# Patient Record
Sex: Female | Born: 1995 | Race: White | Hispanic: Yes | Marital: Single | Smoking: Never smoker
Health system: Southern US, Community
[De-identification: ages and names within clinical notes are randomized; demographics above are authoritative.]

## PROBLEM LIST (undated history)

## (undated) ENCOUNTER — Inpatient Hospital Stay (HOSPITAL_COMMUNITY): Payer: Self-pay

## (undated) DIAGNOSIS — G43909 Migraine, unspecified, not intractable, without status migrainosus: Secondary | ICD-10-CM

## (undated) DIAGNOSIS — K219 Gastro-esophageal reflux disease without esophagitis: Secondary | ICD-10-CM

## (undated) DIAGNOSIS — F410 Panic disorder [episodic paroxysmal anxiety] without agoraphobia: Secondary | ICD-10-CM

## (undated) HISTORY — DX: Panic disorder (episodic paroxysmal anxiety): F41.0

## (undated) HISTORY — DX: Gastro-esophageal reflux disease without esophagitis: K21.9

## (undated) HISTORY — DX: Migraine, unspecified, not intractable, without status migrainosus: G43.909

---
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
## 2013-05-05 ENCOUNTER — Emergency Department (INDEPENDENT_AMBULATORY_CARE_PROVIDER_SITE_OTHER)
Admission: EM | Admit: 2013-05-05 | Discharge: 2013-05-05 | Disposition: A | Discharge status: Home / Self Care | Payer: Medicaid Other | Source: Home / Self Care | Attending: Family Medicine | Admitting: Family Medicine

## 2013-05-05 ENCOUNTER — Encounter (HOSPITAL_COMMUNITY): Payer: Self-pay | Admitting: Emergency Medicine

## 2013-05-05 DIAGNOSIS — K5289 Other specified noninfective gastroenteritis and colitis: Secondary | ICD-10-CM

## 2013-05-05 DIAGNOSIS — K529 Noninfective gastroenteritis and colitis, unspecified: Secondary | ICD-10-CM

## 2013-05-05 LAB — POCT PREGNANCY, URINE: PREG TEST UR: NEGATIVE

## 2013-05-05 LAB — POCT URINALYSIS DIP (DEVICE)
Bilirubin Urine: NEGATIVE
GLUCOSE, UA: NEGATIVE mg/dL
Ketones, ur: NEGATIVE mg/dL
NITRITE: NEGATIVE
PROTEIN: NEGATIVE mg/dL
Specific Gravity, Urine: 1.02 (ref 1.005–1.030)
UROBILINOGEN UA: 0.2 mg/dL (ref 0.0–1.0)
pH: 6 (ref 5.0–8.0)

## 2013-05-05 MED ORDER — ONDANSETRON 4 MG PO TBDP
4.0000 mg | ORAL_TABLET | Freq: Once | ORAL | Status: AC
  Administered 2013-05-05: 4 mg via ORAL

## 2013-05-05 MED ORDER — ONDANSETRON HCL 4 MG PO TABS: 4.0000 mg | ORAL_TABLET | Freq: Three times a day (TID) | ORAL | Status: DC | PRN

## 2013-05-05 MED ORDER — ONDANSETRON 4 MG PO TBDP
ORAL_TABLET | ORAL | Status: AC
  Filled 2013-05-05: qty 1

## 2013-05-05 NOTE — ED Notes (Signed)
Pt given sprite for fluid challenge.  Mw,cma

## 2013-05-05 NOTE — ED Provider Notes (Signed)
CSN: [id number]     Arrival date & time 05/05/13  1206 History   First MD Initiated Contact with Patient 05/05/13 1241     Chief Complaint  Patient presents with  . Emesis  . Constipation   (Consider location/radiation/quality/duration/timing/severity/associated sxs/prior Treatment) HPI Comments: Patient reports 3 days of nausea and and diarrhea with associated abdominal cramping. Denies vomiting but states she feels "queasy" when she tries to eat or drink something. States occasional small amount of blood on toilet tissue when she wipes, but she assumes this is from frequent BM's. Denies urinary symptoms. No known ill contacts.  LNMP: 04/09/2013 No recent travel, antibiotic use or raw meats or seafood.   The history is provided by the patient and a parent.    Past Medical History  Diagnosis Date  . Pregnant state, incidental   . No pertinent past medical history   . GERD (gastroesophageal reflux disease)    Past Surgical History  Procedure Laterality Date  . No past surgeries    . Cesarean section  11/23/2011    Procedure: CESAREAN SECTION;  Surgeon: Antionette Char, MD;  Location: [facility];  Service: Gynecology;  Laterality: N/A;  Primary Cesarean Section Delivery Boy @ 0240,  Apgars 6/8   Family History  Problem Relation Age of Onset  . Hyperthyroidism Mother   . Asthma Brother   . Hypertension Maternal Grandmother   . Hyperthyroidism Maternal Grandmother    History  Substance Use Topics  . Smoking status: Never Smoker   . Smokeless tobacco: Never Used  . Alcohol Use: No   OB History   Grav Para Term Preterm Abortions TAB SAB Ect Mult Living   1 1 1  0 0 0 0 0 0 0     Review of Systems  Constitutional: Positive for appetite change. Negative for fever and chills.  HENT: Negative.   Eyes: Negative.   Respiratory: Negative.   Cardiovascular: Negative.   Gastrointestinal: Positive for nausea and diarrhea. Negative for vomiting and abdominal pain.       See HPI   Endocrine: Negative for polydipsia, polyphagia and polyuria.  Genitourinary: Negative.   Musculoskeletal: Negative.   Skin: Negative.   Neurological: Negative.   Psychiatric/Behavioral: Negative for confusion.    Allergies  Review of patient's allergies indicates no known allergies.  Home Medications   Current Outpatient Rx  Name  Route  Sig  Dispense  Refill  . HYDROcodone-acetaminophen (NORCO/VICODIN) 5-325 MG per tablet   Oral   Take 1 tablet by mouth every 6 (six) hours as needed for pain (Take 1 - 2 tablets every 4 - 6 hours.).   20 tablet   0   . levonorgestrel (MIRENA) 20 MCG/24HR IUD   Intrauterine   1 each by Intrauterine route once.         . methocarbamol (ROBAXIN) 750 MG tablet   Oral   Take 1 tablet (750 mg total) by mouth 4 (four) times daily as needed (Take 1 tablet every 6 hours as needed for muscle spasms.).   20 tablet   0   . Multiple Vitamin (MULTIVITAMIN WITH MINERALS) TABS   Oral   Take 1 tablet by mouth daily.         . naproxen (NAPROSYN) 500 MG tablet   Oral   Take 1 tablet (500 mg total) by mouth 2 (two) times daily as needed.   30 tablet   0    BP 110/76  Pulse 80  Temp(Src) 98.6 F (37 C) (  Oral)  Resp 20  SpO2 98%  LMP 04/09/2013  Breastfeeding? No Physical Exam  Nursing note and vitals reviewed. Constitutional: She is oriented to person, place, and time. She appears well-developed and well-nourished. No distress.  HENT:  Head: Normocephalic and atraumatic.  Mouth/Throat: Oropharynx is clear and moist.  Eyes: Conjunctivae are normal. No scleral icterus.  Neck: Normal range of motion and full passive range of motion without pain. Neck supple.  Cardiovascular: Normal rate, regular rhythm and normal heart sounds.   Pulmonary/Chest: Effort normal and breath sounds normal.  Abdominal: Soft. Bowel sounds are normal. She exhibits no distension and no mass. There is no tenderness. There is no rebound, no guarding and no CVA  tenderness.  Musculoskeletal: Normal range of motion.  Lymphadenopathy:    She has cervical adenopathy.  Neurological: She is alert and oriented to person, place, and time.  Skin: Skin is warm and dry. No rash noted.  Psychiatric: She has a normal mood and affect. Her behavior is normal.    ED Course  Procedures (including critical care time) Labs Review Labs Reviewed - No data to display Imaging Review No results found.    MDM  UA only with trace LE and Hgb. Will send for C&S.  UPT negative. Exam and hx largely consistent with viral gastroenteritis. No focal abdominal pain. Will advise clear liquid diet for next 12-24 hours and zofran as prescribed for nausea. Has appointment with [facility] that she should keep for follow up on 05/06/2013. If symptoms become suddenly worse or severe, she should report to her nearest ER for assistance.   Jess BartersJennifer Lee Pleasant ViewPresson, [place] 05/05/13 1351

## 2013-05-05 NOTE — ED Notes (Signed)
C/o vomiting and constipation x 3 days.   States did have one diarrhea stool yesterday.  Noticed blood in stool for the past 3 days. Also having abdominal cramping and dizziness.   No otc meds tried for symptoms.

## 2013-05-06 ENCOUNTER — Ambulatory Visit (INDEPENDENT_AMBULATORY_CARE_PROVIDER_SITE_OTHER): Payer: Medicaid Other | Admitting: Family Medicine

## 2013-05-06 ENCOUNTER — Encounter: Payer: Self-pay | Admitting: Family Medicine

## 2013-05-06 VITALS — BP 112/70 | HR 64 | Temp 97.7°F | Ht 61.0 in | Wt 120.0 lb

## 2013-05-06 DIAGNOSIS — R112 Nausea with vomiting, unspecified: Secondary | ICD-10-CM

## 2013-05-06 DIAGNOSIS — R5381 Other malaise: Secondary | ICD-10-CM

## 2013-05-06 DIAGNOSIS — R531 Weakness: Secondary | ICD-10-CM | POA: Insufficient documentation

## 2013-05-06 DIAGNOSIS — D509 Iron deficiency anemia, unspecified: Secondary | ICD-10-CM

## 2013-05-06 DIAGNOSIS — Z23 Encounter for immunization: Secondary | ICD-10-CM

## 2013-05-06 DIAGNOSIS — R5383 Other fatigue: Secondary | ICD-10-CM

## 2013-05-06 LAB — CBC WITH DIFFERENTIAL/PLATELET
BASOS ABS: 0 10*3/uL (ref 0.0–0.1)
BASOS PCT: 0 % (ref 0–1)
EOS ABS: 0.1 10*3/uL (ref 0.0–0.7)
EOS PCT: 1 % (ref 0–5)
HCT: 37.2 % (ref 36.0–46.0)
Hemoglobin: 13 g/dL (ref 12.0–15.0)
LYMPHS ABS: 1.3 10*3/uL (ref 0.7–4.0)
Lymphocytes Relative: 31 % (ref 12–46)
MCH: 28.8 pg (ref 26.0–34.0)
MCHC: 34.9 g/dL (ref 30.0–36.0)
MCV: 82.5 fL (ref 78.0–100.0)
Monocytes Absolute: 0.4 10*3/uL (ref 0.1–1.0)
Monocytes Relative: 9 % (ref 3–12)
NEUTROS PCT: 59 % (ref 43–77)
Neutro Abs: 2.5 10*3/uL (ref 1.7–7.7)
PLATELETS: 162 10*3/uL (ref 150–400)
RBC: 4.51 MIL/uL (ref 3.87–5.11)
RDW: 13.7 % (ref 11.5–15.5)
WBC: 4.2 10*3/uL (ref 4.0–10.5)

## 2013-05-06 MED ORDER — POLYETHYLENE GLYCOL 3350 17 GM/SCOOP PO POWD: 17.0000 g | Freq: Two times a day (BID) | ORAL | Status: DC | PRN

## 2013-05-06 NOTE — Patient Instructions (Signed)
Dieta para la diarrea - Adultos   (Diet for Diarrhea, Adult)   Las deposiciones acuosas frecuentes (diarrea) pueden ser originados o empeorar por algunos alimentos o bebidas. La diarrea puede mejorarse con un cambio en la dieta. Como la diarrea puede durar hasta 7 días, es fácil perder mucha cantidad de líquidos del organismo y deshidratarse. Los líquidos que se pierden deben reponerse. Junto con una dieta equilibrada y beber una gran cantidad de líquido para mantener la orina de tono claro o color amarillo pálido.  INSTRUCCIONES PARA LA DIETA   · Asegure una adecuada ingesta de líquidos (hidratación). Evite los líquidos que contengan azúcares simples o las bebidas deportivas, los jugos de frutas, los productos derivados de la leche entera y las gaseosas. Si bebe la cantidad suficiente de líquidos, la orina debe ser clara o amarillo pálido. Una solución de rehidratación oral se puede comprar en las farmacias, en las tiendas minoristas y por Internet. Se puede preparar una solución de rehidratación oral casera con los siguientes ingredientes:  ·    cucharadita de sal.  · ¾ cucharadita de bicarbonato.  ·  de cucharadita de sal sustituta (cloruro de potasio).  · 1  cucharada de azúcar.  · 1l (34 onzas) de agua.  · Ciertos alimentos y bebidas pueden aumentar la velocidad a la que el alimento progresa a través del tracto gastrointestinal (GI). Estos alimentos y bebidas deben evitarse e incluyen:  · Bebidas alcohólicas y con cafeína.  · Alimentos ricos en fibra, como frutas y verduras, nueces, semillas, panes y cereales integrales.  · Alimentos y bebidas endulzados con alcoholes de azúcar, tales como xilitol, sorbitol, y manitol.  · Algunos alimentos pueden ser bien tolerados y puede ayudar a espesar las heces, incluyendo:  · Alimentos con almidón, como arroz, pan, pasta, cereales bajos en azúcar, avena, sémola de maíz, papas al horno, galletas y panecillos.    · Bananas.    · Puré de manzana.  · Agregue alimentos ricos  en probióticos a la dieta del niño para ayudar a aumentar las bacterias saludables en el tracto gastrointestinal, como el yogur y productos lácteos fermentados.  ALIMENTOS Y BEBIDAS RECOMENDADOS   Féculas  Alimentos con menos de 2 g de fibra por porción.   · Recomendados:  Pan blanco, francés, pita, bollos y rosquillas. Muffins, pan ácimo. Galletas de agua, saladas o de graham. Pretzel, biscotes, bizcochos. Maíz refinado, trigo, arroz. Patatas preparadas de cualquier modo sin piel, macaroni, espaghetti, fideos, arroz refinado.  · Evite:  Pan, bollos o galletas preparadas con trigo entero, multigranos, salvado, semillas, frutos secos o coco. Tortillas de maíz o tacos. Cereales que contengan granos enteros, multigranos, salvado, coco, frutos secos o pasas de uva. Harina de avena cocida o seca. Cereales de grano grueso, granola. Cereales promocionados como con "alto contenido de fibra". Cáscara de patatas. Pasta integral, arroz marrón, arroz salvaje. Palomitas de maíz. Batatas. Panecillos dulces, donas, panqueques, waffles, pan dulce.  Vegetales  · Recomendados: Jugo de tomates o de vegetales. Vegetales bien cocidos o enlatados sin semillas. Frescos Lechuga tierna, pepino sin cáscara, repollos, espinacas, brotes de soja.  · Evite: [place], cocidos o enlatados: [place]as, porotos, remolacha, bróccoli, repollitos de [place], maíz, coles, legumbres, arvejas, batatas. Cocidos: Repollo verde o rojo, espinacas. Evite las porciones grandes de cualquier vegetal, debido a que los vegetales disminuyen su tamaño al cocinarlos y contienen más fibras por porción.  Frutas  · Recomendados: Cocidas o enlatadas: Duraznos, puré de manzanas, melón, cerezas, cóctel de frutas, pomelo, uvas, kiwi, naranjas, melocotón, pera, ciruelas,   sandías. Frescos: Manzanas sin la piel, banana madura, uvas, melón, cerezas, pomelo, duraznos, naranjas, ciruelas. Limite las porciones a ½ taza o1 unidad.  · Evite: Frescos: Manzana con piel, damasco, mango,  pera, frambuesa, frutillas. Jugo de ciruela, compota o ciruelas secas. Frutas secas, pasas de uva, dátiles. Evite porciones grandes de todas las frutas frescas.  Proteínas  · Recomendados: Carne molida o un bife tierno bien cocido, jamón, ternera, cordero, cerdo o aves. Huevos. pescado, ostra, langostinos, langosta, frutos de mar. Hígado y otros órganos.  · Evite: Carnes duras y fibrosas con cartílago. Mantequilla de maní, suave o entera. Queso, frutos secos, semillas, legumbres, arvejas secas, lentejas.  Lácteos:  · Recomendados: Yogur, leche sin lactosa, kéfir, yogur bebible, suero de leche, leche de soja, o queso duro común.  · Evite: Leche, leche con chocolate, bebidas a base de leche, tales como batidos.  Sopas  · Recomendados: Consomé, caldo o sopas hechas con los alimentos permitidos. Cualquier sopa colada.  · Evite: Sopas hechas con vegetales no permitidos, sopas basadas en cremas o leche.  Postres y dulces  · Recomendados: Gelatina sin azúcar, helados de agua sin azúcar.  · Evite: Tortas y masitas, pasteles hechos con frutas permitidas, budines, natillas, pasteles con crema. Gelatina, frutas, hielo, sorbetes, helados de agua. Helados, batidos sin frutos secos. Caramelos duros, miel, gelatina, melaza, jarabes, azúcar, jarabe de chocolate, pastillas de goma, malvaviscos.  Grasas y aceites  · Recomendados: Limite las grasas a menos de 8 cucharaditas por día.  · Evite: Semillas, frutos secos, aceitunas, paltas. Margarina, manteca, crema, mayonesa, aceites para ensaladas, aderezos para ensaladas. Salsas, tocino sin corteza.  Bebidas  · Recomendados: Agua, tés descafeinados, soluciones de rehidratación oral, bebidas sin azúcar no endulzados con alcoholes de azúcar.  · Evite: Los jugos de frutas, bebidas con cafeína (café, té, refrescos), bebidas alcohólicas, bebidas deportivas, o gaseosa lima-limón.  Condimentos  · Recomendados: Ketchup, mostaza, rábano picante, el vinagre, el cacao en polvo. Especias con  moderación: pimienta de [place], alb[place], laurel, polvo u hojas de apio, canela, comino en polvo, polvo de curry, jengibre, macis, mejorana, cebolla o ajo en polvo, orégano, pimentón, perejil, pimienta, romero, salvia, ajedrea, estragón, tomillo, cúrcuma.  · Evite: Coco, miel.  Document Released: 03/20/2005 Document Revised: 12/13/2011  Exit[employer]® Patient Information ©2014 [employer].

## 2013-05-06 NOTE — Assessment & Plan Note (Signed)
3 days of n/v/d, likely constipation +/- viral gastro - brat diet, zofran - miralax clean out  - check TSH, strong family history of hypothyroid

## 2013-05-06 NOTE — Assessment & Plan Note (Signed)
H/o weakness with anemia and vitamin D deficiency - check CBC and vit D level given history - check TSH given family history - call with results and supplements if needed

## 2013-05-06 NOTE — Progress Notes (Signed)
   Subjective:    Patient ID: Rachel Mcclure, female    DOB: Nov 18, 1995, 18 y.o.   MRN: [id number]  HPI Pt presents for nausea/vomiting for 3 days. Pt reports chronic diarrhea with straining until 3 days ago when she started having diarrhea, n/v and LLQ abdominal pain. She has tried zofran which relieves the nausea but she still has the pain and diarrhea. She denies fevers, chills. She reports several sick contacts with similar symptoms over the past few weeks. She reports a small amount of blood on tissue after straining to go to the bathroom. Goes 4 days between bowel movements often and when she does go they are hard balls. She also reports weakness which is similar to how she felt when she was anemic after giving birth, this has been going on for months and is not related to the GI symptoms.   Review of Systems  Constitutional: Negative for fever and chills.  Respiratory: Negative for shortness of breath.   Cardiovascular: Negative for chest pain.  Gastrointestinal: Positive for nausea, vomiting, abdominal pain, diarrhea, constipation and blood in stool.  Neurological: Positive for weakness and headaches.       Objective:   Physical Exam  Nursing note and vitals reviewed. Constitutional: She is oriented to person, place, and time. She appears well-developed and well-nourished. No distress.  HENT:  Head: Normocephalic and atraumatic.  Eyes: Conjunctivae are normal. Right eye exhibits no discharge. Left eye exhibits no discharge. No scleral icterus.  Neck: Normal range of motion. No thyromegaly present.  Cardiovascular: Normal rate, regular rhythm and normal heart sounds.   No murmur heard. Pulmonary/Chest: Effort normal and breath sounds normal. No respiratory distress. She has no wheezes.  Abdominal: Soft. Bowel sounds are normal. She exhibits no distension and no mass. There is tenderness. There is no rebound and no guarding.  LLQ tenderness. Diffuse fullness consistent with large  stool burden  Neurological: She is alert and oriented to person, place, and time.  Skin: Skin is warm and dry. No rash noted. She is not diaphoretic.  Psychiatric: She has a normal mood and affect. Her behavior is normal.          Assessment & Plan:

## 2013-05-07 ENCOUNTER — Telehealth: Payer: Self-pay | Admitting: Family Medicine

## 2013-05-07 ENCOUNTER — Other Ambulatory Visit: Payer: Self-pay | Admitting: Family Medicine

## 2013-05-07 LAB — VITAMIN D 25 HYDROXY (VIT D DEFICIENCY, FRACTURES): VIT D 25 HYDROXY: 19 ng/mL — AB (ref 30–89)

## 2013-05-07 LAB — TSH: TSH: 0.935 u[IU]/mL (ref 0.350–4.500)

## 2013-05-07 MED ORDER — CHOLECALCIFEROL 25 MCG (1000 UT) PO TABS: 1000.0000 [IU] | ORAL_TABLET | Freq: Every day | ORAL | Status: DC

## 2013-05-07 NOTE — Telephone Encounter (Signed)
Discussed lab results. Normal TSH and CBC, vit D deficiency. Supplement prescribed.  Abdominal symptoms somewhat improved since yesterday, still no bowel movement, advised continuing miralax.

## 2013-05-08 NOTE — ED Provider Notes (Signed)
Medical screening examination/treatment/procedure(s) were performed by a resident physician or non-physician practitioner and as the supervising physician I was immediately available for consultation/collaboration.  Clementeen GrahamEvan Domenick Quebedeaux, MD    Rodolph BongEvan S Alim Cattell, MD 05/08/13 [phone]

## 2013-12-21 ENCOUNTER — Emergency Department (HOSPITAL_COMMUNITY)
Admission: EM | Admit: 2013-12-21 | Discharge: 2013-12-21 | Discharge status: Against Medical Advice | Payer: Medicaid Other | Attending: Emergency Medicine | Admitting: Emergency Medicine

## 2013-12-21 ENCOUNTER — Encounter (HOSPITAL_COMMUNITY): Payer: Self-pay | Admitting: Emergency Medicine

## 2013-12-21 DIAGNOSIS — R209 Unspecified disturbances of skin sensation: Secondary | ICD-10-CM | POA: Diagnosis not present

## 2013-12-21 DIAGNOSIS — R064 Hyperventilation: Secondary | ICD-10-CM | POA: Insufficient documentation
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
## 2014-01-07 ENCOUNTER — Emergency Department (HOSPITAL_COMMUNITY): Payer: Medicaid Other

## 2014-01-07 ENCOUNTER — Emergency Department (HOSPITAL_COMMUNITY)
Admission: EM | Admit: 2014-01-07 | Discharge: 2014-01-07 | Disposition: A | Discharge status: Home / Self Care | Payer: Medicaid Other | Attending: Emergency Medicine | Admitting: Emergency Medicine

## 2014-01-07 ENCOUNTER — Encounter (HOSPITAL_COMMUNITY): Payer: Self-pay | Admitting: Emergency Medicine

## 2014-01-07 DIAGNOSIS — Z8719 Personal history of other diseases of the digestive system: Secondary | ICD-10-CM | POA: Diagnosis not present

## 2014-01-07 DIAGNOSIS — R1013 Epigastric pain: Secondary | ICD-10-CM | POA: Insufficient documentation

## 2014-01-07 DIAGNOSIS — R0789 Other chest pain: Secondary | ICD-10-CM | POA: Insufficient documentation

## 2014-01-07 DIAGNOSIS — R079 Chest pain, unspecified: Secondary | ICD-10-CM | POA: Diagnosis present

## 2014-01-07 LAB — CBC
HCT: 36.2 % (ref 36.0–46.0)
Hemoglobin: 12.6 g/dL (ref 12.0–15.0)
MCH: 29.2 pg (ref 26.0–34.0)
MCHC: 34.8 g/dL (ref 30.0–36.0)
MCV: 83.8 fL (ref 78.0–100.0)
Platelets: 178 10*3/uL (ref 150–400)
RBC: 4.32 MIL/uL (ref 3.87–5.11)
RDW: 12.3 % (ref 11.5–15.5)
WBC: 4.2 10*3/uL (ref 4.0–10.5)

## 2014-01-07 LAB — BASIC METABOLIC PANEL
Anion gap: 10 (ref 5–15)
BUN: 10 mg/dL (ref 6–23)
CO2: 25 mEq/L (ref 19–32)
CREATININE: 0.76 mg/dL (ref 0.50–1.10)
Calcium: 8.8 mg/dL (ref 8.4–10.5)
Chloride: 105 mEq/L (ref 96–112)
GFR calc non Af Amer: >90 mL/min (ref >90)
Glucose, Bld: 100 mg/dL — ABNORMAL HIGH (ref 70–99)
Potassium: 3.7 mEq/L (ref 3.7–5.3)
Sodium: 140 mEq/L (ref 137–147)

## 2014-01-07 LAB — I-STAT TROPONIN, ED: Troponin i, poc: 0.01 ng/mL (ref 0.00–0.08)

## 2014-01-07 MED ORDER — OMEPRAZOLE 20 MG PO CPDR: 20.0000 mg | DELAYED_RELEASE_CAPSULE | Freq: Every day | ORAL | Status: DC

## 2014-01-07 MED ORDER — SUCRALFATE 1 G PO TABS: 1.0000 g | ORAL_TABLET | Freq: Four times a day (QID) | ORAL | Status: DC

## 2014-01-07 NOTE — Discharge Instructions (Signed)
As discussed, your evaluation today has been largely reassuring.  But, it is important that you monitor your condition carefully, and do not hesitate to return to the ED if you develop new, or concerning changes in your condition.  Otherwise, please follow-up with your physician for appropriate ongoing care.  Your symptoms are likely due to irritation of your esophagus and/or chest wall.  You are being started on a course of medication for esophageal irritation.

## 2014-01-07 NOTE — ED Notes (Signed)
Pt. States pain is worse when she takes a deep breath. She states that the pain is reproducible when you press on her chest. It also hurts when she picks up her 35lbs. [age]yr. Old.

## 2014-01-07 NOTE — ED Notes (Signed)
Pt reports sob and R sided cp x 3 days. Pt does take birth control. Pt reports pain into front of neck as well. Pt speaking in full sentences, skin warm and dry. Pain increases with breathing.

## 2014-01-07 NOTE — ED Provider Notes (Signed)
CSN: [id number]     Arrival date & time 01/07/14  1908 History   First MD Initiated Contact with Patient 01/07/14 2202     Chief Complaint  Patient presents with  . Chest Pain     HPI  Patient presents with 3 days of sternal chest pain.  The pain is nonradiating, sore, severe, midline. There is no associated fever, chills, cough, dyspnea. The pain is nonexertional nonpleuritic. No abdominal pain. Patient has no history of heart disease, does not smoke. No family history of early coronary disease. No clear alleviating or exacerbating factors.  Past Medical History  Diagnosis Date  . Pregnant state, incidental   . No pertinent past medical history   . GERD (gastroesophageal reflux disease)    Past Surgical History  Procedure Laterality Date  . No past surgeries    . Cesarean section  11/23/2011    Procedure: CESAREAN SECTION;  Surgeon: Antionette Char, MD;  Location: [facility];  Service: Gynecology;  Laterality: N/A;  Primary Cesarean Section Delivery Boy @ 0240,  Apgars 6/8   Family History  Problem Relation Age of Onset  . Hyperthyroidism Mother   . Asthma Brother   . Hypertension Maternal Grandmother   . Hyperthyroidism Maternal Grandmother    History  Substance Use Topics  . Smoking status: Never Smoker   . Smokeless tobacco: Never Used  . Alcohol Use: No   OB History   Grav Para Term Preterm Abortions TAB SAB Ect Mult Living   1 1 1  0 0 0 0 0 0 0     Review of Systems  Constitutional:       Per HPI, otherwise negative  HENT:       Per HPI, otherwise negative  Respiratory:       Per HPI, otherwise negative  Cardiovascular:       Per HPI, otherwise negative  Gastrointestinal: Negative for vomiting.  Endocrine:       Negative aside from HPI  Genitourinary:       Neg aside from HPI   Musculoskeletal:       Per HPI, otherwise negative  Skin: Negative.   Neurological: Negative for syncope.      Allergies  Review of patient's allergies indicates no  known allergies.  Home Medications   Prior to Admission medications   Medication Sig Start Date End Date Taking? Authorizing Provider  ibuprofen (ADVIL,MOTRIN) 800 MG tablet Take 800 mg by mouth every 8 (eight) hours as needed for mild pain.   Yes Historical Provider, MD  levonorgestrel (MIRENA) 20 MCG/24HR IUD 1 each by Intrauterine route once. 04/24/2012    Historical Provider, MD   BP 124/81  Pulse 77  Temp(Src) 97.7 F (36.5 C) (Oral)  Resp 17  Ht 5\' 2"  (1.575 m)  Wt 115 lb (52.164 kg)  BMI 21.03 kg/m2  SpO2 100%  LMP 12/23/2013 Physical Exam  Nursing note and vitals reviewed. Constitutional: She is oriented to person, place, and time. She appears well-developed and well-nourished. No distress.  HENT:  Head: Normocephalic and atraumatic.  Eyes: Conjunctivae and EOM are normal.  Cardiovascular: Normal rate and regular rhythm.   Pulmonary/Chest: Effort normal and breath sounds normal. No stridor. No respiratory distress.  Abdominal: She exhibits no distension. There is tenderness in the epigastric area.  Musculoskeletal: She exhibits no edema.  Neurological: She is alert and oriented to person, place, and time. No cranial nerve deficit.  Skin: Skin is warm and dry.  Psychiatric: She has a normal  mood and affect.    ED Course  Procedures (including critical care time) Labs Review Labs Reviewed  BASIC METABOLIC PANEL - Abnormal; Notable for the following:    Glucose, Bld 100 (*)    All other components within normal limits  CBC  I-STAT TROPOININ, ED    Imaging Review Dg Chest 2 View  01/07/2014   CLINICAL DATA:  Right side chest pain and shortness of breath for 3 days.  EXAM: CHEST  2 VIEW  COMPARISON:  PA and lateral chest 04/16/2012.  FINDINGS: The lungs are clear. Heart size is normal. No pneumothorax or pleural effusion. No focal bony abnormality.  IMPRESSION: Negative chest.   Electronically Signed   By: Drusilla Kannerhomas  Dalessio M.D.   On: 01/07/2014 21:33     EKG  Interpretation   Date/Time:  Wednesday January 07 2014 19:15:15 EDT Ventricular Rate:  80 PR Interval:  120 QRS Duration: 76 QT Interval:  352 QTC Calculation: 405 R Axis:   87 Text Interpretation:  Normal sinus rhythm with sinus arrhythmia Normal ECG  Sinus rhythm Artifact Borderline ECG Confirmed by Gerhard MunchLOCKWOOD, Daiton Cowles  MD  ([id number]) on 01/07/2014 10:27:23 PM      Patient's evaluation is largely reassuring.  On repeat exam she is in no distress. I discussed all findings with patient and her mother.  The  MDM   This gentleman presents with new chest pain.  Patient's sternal discomfort, epigastric tenderness to palpation, and her presentation is most consistent with GI etiology. No physical exam findings consistent with PE, nor ACS.  Patient has minimal risk profile. Patient was discharged in stable condition with initiation of GI therapy, primary care followup   Gerhard Munchobert Tyrell Brereton, MD 01/07/14 2310

## 2014-01-23 ENCOUNTER — Ambulatory Visit (INDEPENDENT_AMBULATORY_CARE_PROVIDER_SITE_OTHER): Payer: Medicaid Other | Admitting: Family Medicine

## 2014-01-23 ENCOUNTER — Encounter: Payer: Self-pay | Admitting: Family Medicine

## 2014-01-23 VITALS — BP 122/73 | HR 60 | Temp 97.6°F | Wt 114.0 lb

## 2014-01-23 DIAGNOSIS — Z23 Encounter for immunization: Secondary | ICD-10-CM

## 2014-01-23 DIAGNOSIS — K219 Gastro-esophageal reflux disease without esophagitis: Secondary | ICD-10-CM

## 2014-01-23 MED ORDER — FAMOTIDINE 20 MG PO TABS: 20.0000 mg | ORAL_TABLET | Freq: Two times a day (BID) | ORAL | Status: DC

## 2014-01-23 NOTE — Progress Notes (Signed)
   Subjective:    Patient ID: Rachel Mcclure, female    DOB: July 04, 1995, 18 y.o.   MRN: [id number]  Chest Pain    Pt presents for f/u of chest pain for which she went to the ED 2 weeks ago. She had normal labwork, chest xray and EKG at that time. She was given omeprazole but it made her very sleepy so she stopped and has been taking tums, which helps. The pain is worse with exertion, no sweating, nausea, SOB. Not relieved by rest.   Review of Systems  Cardiovascular: Positive for chest pain.   See HPI    Objective:   Physical Exam  Nursing note and vitals reviewed. Constitutional: She is oriented to person, place, and time. She appears well-developed and well-nourished. No distress.  HENT:  Head: Normocephalic and atraumatic.  Eyes: Conjunctivae are normal. Right eye exhibits no discharge. Left eye exhibits no discharge. No scleral icterus.  Cardiovascular: Normal rate, regular rhythm, normal heart sounds and intact distal pulses.   No murmur heard. Pulmonary/Chest: Effort normal and breath sounds normal. No respiratory distress. She has no wheezes.  Abdominal: Soft. She exhibits no distension and no mass. There is tenderness. There is no rebound and no guarding.  Mild epigastric/LUQ tenderness to deep palpation  Neurological: She is alert and oriented to person, place, and time.  Skin: Skin is warm and dry. No rash noted. She is not diaphoretic.  Psychiatric: She has a normal mood and affect. Her behavior is normal.          Assessment & Plan:

## 2014-01-23 NOTE — Assessment & Plan Note (Signed)
Burning chest pain, worse after eating, eats lots of greasy and spicy foods - dietary recommendations given - omeprazole causing sleepiness, will switch to famotidine

## 2014-01-23 NOTE — Patient Instructions (Signed)
I think that your chest pain is being caused by reflux. I am prescribing a medication for reflux that should not make you sleepy like the other one did. I recommend that you make some changes to your diet to help with this problem.   Food Choices for Gastroesophageal Reflux Disease When you have gastroesophageal reflux disease (GERD), the foods you eat and your eating habits are very important. Choosing the right foods can help ease the discomfort of GERD. WHAT GENERAL GUIDELINES DO I NEED TO FOLLOW?  Choose fruits, vegetables, whole grains, low-fat dairy products, and low-fat meat, fish, and poultry.  Limit fats such as oils, salad dressings, butter, nuts, and avocado.  Keep a food diary to identify foods that cause symptoms.  Avoid foods that cause reflux. These may be different for different people.  Eat frequent small meals instead of three large meals each day.  Eat your meals slowly, in a relaxed setting.  Limit fried foods.  Cook foods using methods other than frying.  Avoid drinking alcohol.  Avoid drinking large amounts of liquids with your meals.  Avoid bending over or lying down until 2-3 hours after eating. WHAT FOODS ARE NOT RECOMMENDED? The following are some foods and drinks that may worsen your symptoms: Vegetables Tomatoes. Tomato juice. Tomato and spaghetti sauce. Chili peppers. Onion and garlic. Horseradish. Fruits Oranges, grapefruit, and lemon (fruit and juice). Meats High-fat meats, fish, and poultry. This includes hot dogs, ribs, ham, sausage, salami, and bacon. Dairy Whole milk and chocolate milk. Sour cream. Cream. Butter. Ice cream. Cream cheese.  Beverages Coffee and tea, with or without caffeine. Carbonated beverages or energy drinks. Condiments Hot sauce. Barbecue sauce.  Sweets/Desserts Chocolate and cocoa. Donuts. Peppermint and spearmint. Fats and Oils High-fat foods, including [place] fries and potato chips. Other Vinegar. Strong spices,  such as black pepper, white pepper, red pepper, cayenne, curry powder, cloves, ginger, and chili powder. The items listed above may not be a complete list of foods and beverages to avoid. Contact your dietitian for more information. Document Released: 03/20/2005 Document Revised: 03/25/2013 Document Reviewed: 01/22/2013 [facility]ExitCare Patient Information 2015 [place], [place]. This information is not intended to replace advice given to you by your health care provider. Make sure you discuss any questions you have with your health care provider.

## 2014-02-02 ENCOUNTER — Encounter: Payer: Self-pay | Admitting: Family Medicine

## 2014-02-05 ENCOUNTER — Ambulatory Visit: Payer: Medicaid Other | Admitting: Family Medicine

## 2014-02-17 ENCOUNTER — Encounter: Payer: Self-pay | Admitting: Family Medicine

## 2014-02-17 ENCOUNTER — Ambulatory Visit (INDEPENDENT_AMBULATORY_CARE_PROVIDER_SITE_OTHER): Payer: Medicaid Other | Admitting: Family Medicine

## 2014-02-17 VITALS — BP 109/75 | HR 96 | Temp 97.9°F | Ht 62.0 in | Wt 114.2 lb

## 2014-02-17 DIAGNOSIS — F410 Panic disorder [episodic paroxysmal anxiety] without agoraphobia: Secondary | ICD-10-CM | POA: Insufficient documentation

## 2014-02-17 DIAGNOSIS — F430 Acute stress reaction: Principal | ICD-10-CM

## 2014-02-17 NOTE — Patient Instructions (Addendum)
It was a pleasure to see you today.    [facility] is: [address], [place], [place] [address] [phone]  I would like to have you back in the office after seeing the psychologist.   Follow up with patient's primary physician in 1 month.
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
## 2014-02-18 NOTE — Assessment & Plan Note (Signed)
Patient with intermittent chest pain (L sided) and palpitations, reaction to relationship stress.  Has been to ED for this (most recently 01/07/2014), with normal ECG and lab work.  PHQ9 done today. Patient interested in CBT, not interested in pharmacotherapy. [facility], patient would like to go there as well.  For follow up with primary physician after initial visit with psychology.  Patient not a threat to herself or others based upon my assessment today.

## 2014-03-20 ENCOUNTER — Emergency Department (HOSPITAL_COMMUNITY)
Admission: EM | Admit: 2014-03-20 | Discharge: 2014-03-20 | Disposition: A | Discharge status: Home / Self Care | Payer: Medicaid Other | Attending: Emergency Medicine | Admitting: Emergency Medicine

## 2014-03-20 ENCOUNTER — Encounter (HOSPITAL_COMMUNITY): Payer: Self-pay | Admitting: Emergency Medicine

## 2014-03-20 DIAGNOSIS — K219 Gastro-esophageal reflux disease without esophagitis: Secondary | ICD-10-CM | POA: Insufficient documentation

## 2014-03-20 DIAGNOSIS — Z3202 Encounter for pregnancy test, result negative: Secondary | ICD-10-CM | POA: Insufficient documentation

## 2014-03-20 DIAGNOSIS — F410 Panic disorder [episodic paroxysmal anxiety] without agoraphobia: Secondary | ICD-10-CM | POA: Insufficient documentation

## 2014-03-20 DIAGNOSIS — Z79899 Other long term (current) drug therapy: Secondary | ICD-10-CM | POA: Diagnosis not present

## 2014-03-20 LAB — POC URINE PREG, ED: Preg Test, Ur: NEGATIVE

## 2014-03-20 MED ORDER — HYDROXYZINE HCL 25 MG PO TABS: 25.0000 mg | ORAL_TABLET | Freq: Three times a day (TID) | ORAL | Status: DC | PRN
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
## 2014-03-20 NOTE — Discharge Instructions (Signed)
Panic Attacks °Panic attacks are sudden, short feelings of great fear or discomfort. You may have them for no reason when you are relaxed, when you are uneasy (anxious), or when you are sleeping.  °HOME CARE °· Take all your medicines as told. °· Check with your doctor before starting new medicines. °· Keep all doctor visits. °GET HELP IF: °· You are not able to take your medicines as told. °· Your symptoms do not get better. °· Your symptoms get worse. °GET HELP RIGHT AWAY IF: °· Your attacks seem different than your normal attacks. °· You have thoughts about hurting yourself or others. °· You take panic attack medicine and you have a side effect. °MAKE SURE YOU: °· Understand these instructions. °· Will watch your condition. °· Will get help right away if you are not doing well or get worse. °Document Released: 04/22/2010 Document Revised: 01/08/2013 Document Reviewed: 11/01/2012 °ExitCare® Patient Information ©2015 ExitCare, LLC. This information is not intended to replace advice given to you by your health care provider. Make sure you discuss any questions you have with your health care provider. ° °

## 2014-03-20 NOTE — ED Notes (Signed)
MD at bedside. 

## 2014-04-23 ENCOUNTER — Ambulatory Visit (INDEPENDENT_AMBULATORY_CARE_PROVIDER_SITE_OTHER): Payer: Medicaid Other | Admitting: Family Medicine

## 2014-04-23 ENCOUNTER — Encounter: Payer: Self-pay | Admitting: Family Medicine

## 2014-04-23 VITALS — BP 116/73 | HR 55 | Temp 98.3°F | Ht 62.0 in | Wt 116.4 lb

## 2014-04-23 DIAGNOSIS — F410 Panic disorder [episodic paroxysmal anxiety] without agoraphobia: Secondary | ICD-10-CM

## 2014-04-23 DIAGNOSIS — F430 Acute stress reaction: Principal | ICD-10-CM

## 2014-04-23 NOTE — Patient Instructions (Signed)

## 2014-04-23 NOTE — Assessment & Plan Note (Signed)
Discussed aspects of panic attacks, anxiety, and depression today.  No current SI/HI or access to weapons.   - May be interested in medication including SSRI.  Would avoid habit forming mediations in pt  - [facility].  Gave resources for other psychologists in area she will contact.  [facility] at this time - F/U with PCP as needed.  Discussed need to call if she is having SI/HI.
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
## 2014-10-09 ENCOUNTER — Encounter (HOSPITAL_COMMUNITY): Payer: Self-pay | Admitting: Emergency Medicine

## 2014-10-09 DIAGNOSIS — F419 Anxiety disorder, unspecified: Secondary | ICD-10-CM | POA: Insufficient documentation

## 2014-10-09 DIAGNOSIS — F329 Major depressive disorder, single episode, unspecified: Secondary | ICD-10-CM | POA: Insufficient documentation

## 2014-10-09 DIAGNOSIS — Z8719 Personal history of other diseases of the digestive system: Secondary | ICD-10-CM | POA: Diagnosis not present

## 2014-10-09 DIAGNOSIS — R4182 Altered mental status, unspecified: Secondary | ICD-10-CM | POA: Diagnosis present

## 2014-10-09 LAB — CBG MONITORING, ED: Glucose-Capillary: 93 mg/dL (ref 65–99)

## 2014-10-09 NOTE — ED Notes (Signed)
Patient here via family with complaint of altered mental status. Family explains that patient was listening to music earlier, got up, and became tearful. Patient currently not verbally responding to questions. Would not relax arms for blood pressure tests; required BP reading from leg for accurate test. PERRL. Eye movement noted. Grasped this RNs fingers when instructed to do so. Just told family member that she feels dizzy, but will not speak with caregivers.

## 2014-10-10 ENCOUNTER — Emergency Department (HOSPITAL_COMMUNITY)
Admission: EM | Admit: 2014-10-10 | Discharge: 2014-10-10 | Disposition: A | Discharge status: Home / Self Care | Payer: Medicaid Other | Attending: Emergency Medicine | Admitting: Emergency Medicine

## 2014-10-10 DIAGNOSIS — F419 Anxiety disorder, unspecified: Secondary | ICD-10-CM

## 2014-10-10 DIAGNOSIS — F32A Depression, unspecified: Secondary | ICD-10-CM

## 2014-10-10 DIAGNOSIS — F329 Major depressive disorder, single episode, unspecified: Secondary | ICD-10-CM

## 2014-10-10 LAB — CBC
HCT: 37.3 % (ref 36.0–46.0)
Hemoglobin: 12.8 g/dL (ref 12.0–15.0)
MCH: 28.6 pg (ref 26.0–34.0)
MCHC: 34.3 g/dL (ref 30.0–36.0)
MCV: 83.4 fL (ref 78.0–100.0)
Platelets: 198 10*3/uL (ref 150–400)
RBC: 4.47 MIL/uL (ref 3.87–5.11)
RDW: 12.1 % (ref 11.5–15.5)
WBC: 5.9 10*3/uL (ref 4.0–10.5)

## 2014-10-10 LAB — URINE MICROSCOPIC-ADD ON

## 2014-10-10 LAB — COMPREHENSIVE METABOLIC PANEL
ALT: 13 U/L — ABNORMAL LOW (ref 14–54)
ANION GAP: 7 (ref 5–15)
AST: 18 U/L (ref 15–41)
Albumin: 4 g/dL (ref 3.5–5.0)
Alkaline Phosphatase: 55 U/L (ref 38–126)
BUN: 12 mg/dL (ref 6–20)
CHLORIDE: 105 mmol/L (ref 101–111)
CO2: 26 mmol/L (ref 22–32)
Calcium: 9.3 mg/dL (ref 8.9–10.3)
Creatinine, Ser: 0.91 mg/dL (ref 0.44–1.00)
GFR calc Af Amer: >60 mL/min (ref >60)
GFR calc non Af Amer: >60 mL/min (ref >60)
Glucose, Bld: 101 mg/dL — ABNORMAL HIGH (ref 65–99)
Potassium: 3.6 mmol/L (ref 3.5–5.1)
SODIUM: 138 mmol/L (ref 135–145)
Total Bilirubin: 0.3 mg/dL (ref 0.3–1.2)
Total Protein: 6.7 g/dL (ref 6.5–8.1)

## 2014-10-10 LAB — URINALYSIS, ROUTINE W REFLEX MICROSCOPIC
BILIRUBIN URINE: NEGATIVE
Glucose, UA: NEGATIVE mg/dL
KETONES UR: NEGATIVE mg/dL
Nitrite: NEGATIVE
PROTEIN: NEGATIVE mg/dL
Specific Gravity, Urine: 1.022 (ref 1.005–1.030)
UROBILINOGEN UA: 1 mg/dL (ref 0.0–1.0)
pH: 7 (ref 5.0–8.0)

## 2014-10-10 LAB — POC URINE PREG, ED: Preg Test, Ur: NEGATIVE

## 2014-10-10 MED ORDER — HYDROXYZINE HCL 25 MG PO TABS
25.0000 mg | ORAL_TABLET | Freq: Once | ORAL | Status: AC
  Administered 2014-10-10: 25 mg via ORAL
  Filled 2014-10-10: qty 1

## 2014-10-10 MED ORDER — HYDROXYZINE HCL 25 MG PO TABS: 25.0000 mg | ORAL_TABLET | Freq: Four times a day (QID) | ORAL | Status: DC | PRN

## 2014-10-10 NOTE — BH Assessment (Addendum)
Reviewed ED notes prior to initiating assessment. Per notes pt is presenting with altered mental status, after becoming tearful earlier in the evening after listening to music. In the ED pt has been minimally responsive. Pt has been seen in ED in the past for panic attacks.   Requested cart be placed with pt for assessment.   Assessment to commence shortly.   First attempt to connect was unsuccessful. Will attempt again at 0140 to allow more time for cart to be placed.    Clista BernhardtNancy Gresia Isidoro, [facility] Triage Specialist 10/10/2014 1:29 AM

## 2014-10-10 NOTE — Discharge Instructions (Signed)
Depression °Depression refers to feeling sad, low, down in the dumps, blue, gloomy, or empty. In general, there are two kinds of depression: °1. Normal sadness or normal grief. This kind of depression is one that we all feel from time to time after upsetting life experiences, such as the loss of a job or the ending of a relationship. This kind of depression is considered normal, is short lived, and resolves within a few days to 2 weeks. Depression experienced after the loss of a loved one (bereavement) often lasts longer than 2 weeks but normally gets better with time. °2. Clinical depression. This kind of depression lasts longer than normal sadness or normal grief or interferes with your ability to function at home, at work, and in school. It also interferes with your personal relationships. It affects almost every aspect of your life. Clinical depression is an illness. °Symptoms of depression can also be caused by conditions other than those mentioned above, such as: °· Physical illness. Some physical illnesses, including underactive thyroid gland (hypothyroidism), severe anemia, specific types of cancer, diabetes, uncontrolled seizures, heart and lung problems, strokes, and chronic pain are commonly associated with symptoms of depression. °· Side effects of some prescription medicine. In some people, certain types of medicine can cause symptoms of depression. °· Substance abuse. Abuse of alcohol and illicit drugs can cause symptoms of depression. °SYMPTOMS °Symptoms of normal sadness and normal grief include the following: °· Feeling sad or crying for short periods of time. °· Not caring about anything (apathy). °· Difficulty sleeping or sleeping too much. °· No longer able to enjoy the things you used to enjoy. °· Desire to be by oneself all the time (social isolation). °· Lack of energy or motivation. °· Difficulty concentrating or remembering. °· Change in appetite or weight. °· Restlessness or  agitation. °Symptoms of clinical depression include the same symptoms of normal sadness or normal grief and also the following symptoms: °· Feeling sad or crying all the time. °· Feelings of guilt or worthlessness. °· Feelings of hopelessness or helplessness. °· Thoughts of suicide or the desire to harm yourself (suicidal ideation). °· Loss of touch with reality (psychotic symptoms). Seeing or hearing things that are not real (hallucinations) or having false beliefs about your life or the people around you (delusions and paranoia). °DIAGNOSIS  °The diagnosis of clinical depression is usually based on how bad the symptoms are and how long they have lasted. Your health care provider will also ask you questions about your medical history and substance use to find out if physical illness, use of prescription medicine, or substance abuse is causing your depression. Your health care provider may also order blood tests. °TREATMENT  °Often, normal sadness and normal grief do not require treatment. However, sometimes antidepressant medicine is given for bereavement to ease the depressive symptoms until they resolve. °The treatment for clinical depression depends on how bad the symptoms are but often includes antidepressant medicine, counseling with a mental health professional, or both. Your health care provider will help to determine what treatment is best for you. °Depression caused by physical illness usually goes away with appropriate medical treatment of the illness. If prescription medicine is causing depression, talk with your health care provider about stopping the medicine, decreasing the dose, or changing to another medicine. °Depression caused by the abuse of alcohol or illicit drugs goes away when you stop using these substances. Some adults need professional help in order to stop drinking or using drugs. °SEEK IMMEDIATE MEDICAL   CARE IF:  You have thoughts about hurting yourself or others.  You lose touch  with reality (have psychotic symptoms).  You are taking medicine for depression and have a serious side effect. FOR MORE INFORMATION  National Alliance on Mental Illness: www.nami.org  National Institute of Mental Health: http://www.maynard.net/www.nimh.nihAK [employer].gov Document Released: 03/17/2000 Document Revised: 08/04/2013 Document Reviewed: 06/19/2011 [facility] Patient Information 2015 [place], [place]. This information is not intended to replace advice given to you by your health care provider. Make sure you discuss any questions you have with your health care provider. My to her evaluated by our therapeutic specialist.  She was given outpatient resources as well as a prescription for Atarax.  Please make sure to see her therapist and be open and forthcoming with your feelings

## 2014-10-10 NOTE — BH Assessment (Addendum)
Tele Assessment Note   Rachel Mcclure is an 19 y.o. female with family origins from [place], who prefers to speak English, BIB family for altered mental status. Pt reports she was in her room listening to music and was thinking about stressors. "I was depressed, I'm having family problems, and then I couldn't breath, and my boyfriend ran and got my mother and told her to bring me here." Pt reports she has been treated in the ED before for panic attacks. She reports she has had 6-10 episodes since September. She was given vistaril in the ED in the past and found it helpful. When pt followed up with PCP she was hesitant to get a prescription for anxiety or depression, fearing she would be come addicted. She began seeing a counselor at [facility] but has not been since the end of 31-Aug-2022 because of feeling too busy. She reports she is under a lot of stress. She reports two of her uncles are very sick and her grandfather recently had surgery on his brain. Pt reports she "is one of the responsible one in the family" and takes on care giving. She reports she had to arrange funeral when her grandmother died in 08/30/2012 and helped with hospice care for her grandfather in 2009/08/30. She reports she has a three year old son that she takes care of and tries to help around the house. Pt reports she rarely leaves the house, and does not engage in activities with friends outside the home lately. Pt also reports lack of relationship with her real father as an additional stressor, "He worries about money, but I tell him all I need is love." At the time of assessment pt was alert and oriented times 4. She was cooperative in answering questions. Speech was soft and slow, mood depressed and anxious with constricted affect. Pt denies SI, HI, AVH, self-harm and SA. She reports while she was opposed to medication initially, she is now open to exploring this, "I don't want to keep ending up in the hospital."  Pt reports she has been feeling  depressed since the beginning of the year and feels like it is getting worse. She reports she feels tired frequently despite sleeping 8 hours, has crying spells, distractibility, loss of pleasures, loss of motivation, and feels overwhelmed. At times she reports losing her appetite. Pt denies SI.   Pt reports she worries a lot about her family, her future, and her son. She reports she has had 6-10 panic attack episodes since September. She denies hx of abuse or neglect.  Pt denies hx of trauma. Denies sx of PTSD, OCD, or specific phobias.   Pt is willing to contact her therapist to resume therapy. She is willing to contact PCP about whether or not she would be a good candidate for medication for anxiety and depression. Pt is able to contract for safety and does not appear a threat to herself or others.   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 GERD (gastroesophageal reflux disease)     Past Surgical History  Procedure Laterality Date  . Cesarean section  11/23/2011    Procedure: CESAREAN SECTION;  Surgeon: Antionette Char, MD;  Location: [facility];  Service: Gynecology;  Laterality: N/A;  Primary Cesarean Section Delivery Boy @ 0240,  Apgars 6/8    Family History:  Family History  Problem Relation Age of  Onset  . Hyperthyroidism Mother   . Asthma Brother   . Hypertension Maternal Grandmother   . Hyperthyroidism Maternal Grandmother     Social History:  reports that she has never smoked. She has never used smokeless tobacco. She reports that she does not drink alcohol or use illicit drugs.  Additional Social History:  Alcohol / Drug Use Pain Medications: See PTA Prescriptions: See PTA Over the Counter: SEE PTA History of alcohol / drug use?: No history of alcohol / drug abuse Longest period of sobriety (when/how long): NA Negative Consequences of Use:   (NA)  CIWA: CIWA-Ar BP: 122/83 mmHg Pulse Rate: (!) 55 COWS:    PATIENT STRENGTHS: (choose at least two) Ability for insight Average or above average intelligence Communication skills  Allergies: No Known Allergies  Home Medications:  (Not in a hospital admission)  OB/GYN Status:  No LMP recorded (lmp unknown).  General Assessment Data [facility] Assessment: In system Is this a Tele or Face-to-Face Assessment?: Tele Assessment Is this an Initial Assessment or a Re-assessment for this encounter?: Initial Assessment Marital status: Long term relationship Is patient pregnant?: No Pregnancy Status: No Living Arrangements: Children, Parent, Other relatives (Mom,3 y.o son, and two brothers) Can pt return to current living arrangement?: Yes Admission Status: Voluntary Is patient capable of signing voluntary admission?: Yes Referral Source: Self/Family/Friend Insurance type: [place] [employer]     Crisis Care Plan Living Arrangements: Children, Parent, Other relatives (Mom,3 y.o son, and two brothers) Name of Psychiatrist: none Name of Therapist: Claris Gowerharlotte at [facility] but has not seen since the end of May   Education Status Is patient currently in school?: No Current Grade: NA Highest grade of school patient has completed: 2612 Name of school: NA Contact person: NA  Risk to self with the past 6 months Suicidal Ideation: No Has patient been a risk to self within the past 6 months prior to admission? : No Suicidal Intent: No Has patient had any suicidal intent within the past 6 months prior to admission? : No Is patient at risk for suicide?: No Suicidal Plan?: No Has patient had any suicidal plan within the past 6 months prior to admission? : No Access to Means: No What has been your use of drugs/alcohol within the last 12 months?: none Previous Attempts/Gestures: No How many times?: 0 Other Self Harm Risks: none Triggers for Past  Attempts: None known Intentional Self Injurious Behavior: None Family Suicide History: No Recent stressful life event(s): Conflict (Comment) (with aunt, and partial with mother due to conflict with aunt) Persecutory voices/beliefs?: No Depression: Yes Depression Symptoms: Despondent, Tearfulness, Isolating, Fatigue, Loss of interest in usual pleasures, Feeling worthless/self pity Substance abuse history and/or treatment for substance abuse?: No Suicide prevention information given to non-admitted patients: Yes  Risk to Others within the past 6 months Homicidal Ideation: No Does patient have any lifetime risk of violence toward others beyond the six months prior to admission? : No Thoughts of Harm to Others: No Current Homicidal Intent: No Current Homicidal Plan: No Access to Homicidal Means: No Identified Victim: none History of harm to others?: No Assessment of Violence: None Noted Violent Behavior Description: none Does patient have access to weapons?: No Criminal Charges Pending?: No Does patient have a court date: No Is patient on probation?: No  Psychosis Hallucinations: None noted Delusions: None noted  Mental Status Report Appearance/Hygiene: Unremarkable Eye Contact: Good Motor Activity: Unremarkable Speech: Soft, Slow Level of Consciousness: Alert Mood: Depressed,  Anxious Affect: Constricted Anxiety Level: Panic Attacks Panic attack frequency: 6-10 times since September 2015 Most recent panic attack: today  Thought Processes: Relevant, Coherent Judgement: Unimpaired Orientation: Place, Person, Time, Situation Obsessive Compulsive Thoughts/Behaviors: None  Cognitive Functioning Concentration: Normal Memory: Recent Intact, Remote Intact IQ: Average Insight: Fair Impulse Control: Good Appetite: Fair Weight Loss: 0 Weight Gain: 0 Sleep: No Change Total Hours of Sleep: 8 Vegetative Symptoms: None  ADLScreening [facility]) Patient's  cognitive ability adequate to safely complete daily activities?: Yes Patient able to express need for assistance with ADLs?: Yes Independently performs ADLs?: Yes (appropriate for developmental age)  Prior Inpatient Therapy Prior Inpatient Therapy: No Prior Therapy Dates: NA Prior Therapy Facilty/Provider(s): NA Reason for Treatment: NA  Prior Outpatient Therapy Prior Outpatient Therapy: Yes Prior Therapy Dates: about 6 months Prior Therapy Facilty/Provider(s): [facility] Reason for Treatment: Anxiety, and depression  Does patient have an ACCT team?: No Does patient have Intensive In-House Services?  : No Does patient have [facility] services? : No Does patient have P4CC services?: No  ADL Screening (condition at time of admission) Patient's cognitive ability adequate to safely complete daily activities?: Yes Is the patient deaf or have difficulty hearing?: No Does the patient have difficulty seeing, even when wearing glasses/contacts?: No Does the patient have difficulty concentrating, remembering, or making decisions?: No Patient able to express need for assistance with ADLs?: Yes Does the patient have difficulty dressing or bathing?: No Independently performs ADLs?: Yes (appropriate for developmental age) Does the patient have difficulty walking or climbing stairs?: No Weakness of Legs: None Weakness of Arms/Hands: None  Home Assistive Devices/Equipment Home Assistive Devices/Equipment: None    Abuse/Neglect Assessment (Assessment to be complete while patient is alone) Physical Abuse: Denies Verbal Abuse: Denies Sexual Abuse: Denies Exploitation of patient/patient's resources: Denies Self-Neglect: Denies Values / Beliefs Cultural Requests During Hospitalization: None Spiritual Requests During Hospitalization: None   Advance Directives (For Healthcare) Does patient have an advance directive?: No Would patient like information on creating an advanced directive?: No  - patient declined information    Additional Information 1:1 In Past 12 Months?: No CIRT Risk: No Elopement Risk: No Does patient have medical clearance?: Yes     Disposition:  Per Hulan Fess, NP pt does not meet inpt criteria and can be discharged with OP resources.   Informed Earley Favor, NP of recommendations and she is in agreement.  Informed Rn of recommendations and she will inform pt and provide with faxed resources.   Faxed resources for pt to pod B.     Clista Bernhardt, [facility] Triage Specialist 10/10/2014 2:19 AM  Disposition Initial Assessment Completed for this Encounter: Yes  Marin Wisner M 10/10/2014 2:18 AM

## 2014-10-10 NOTE — ED Provider Notes (Signed)
CSN: [id number]     Arrival date & time 10/09/14  2318 History   First MD Initiated Contact with Patient 10/10/14 [phone]     Chief Complaint  Patient presents with  . Altered Mental Status     (Consider location/radiation/quality/duration/timing/severity/associated sxs/prior Treatment) HPI Comments: Patient states that she's had increased stress in her life with multiple family members ill.  Tonight, her mother told her she couldn't go see her on hold because her son had a bug bite and this causes her stress level to increase.  She sees a therapist on a regular basis, but they have not prescribed any medication for her the last time she was here she was prescribed Atarax which she states helps but she has not had any since December.  She denies any suicidality or homicidality.  She does states that her mother doesn't understand  Patient is a 19 y.o. female presenting with altered mental status. The history is provided by the patient.  Altered Mental Status Presenting symptoms: partial responsiveness   Severity:  Mild Most recent episode:  Today Episode history:  Single Timing:  Intermittent Progression:  Improving Chronicity:  Recurrent Associated symptoms: no fever, no hallucinations and no headaches   Associated symptoms comment:  Stress   Past Medical History  Diagnosis Date  . GERD (gastroesophageal reflux disease)    Past Surgical History  Procedure Laterality Date  . Cesarean section  11/23/2011    Procedure: CESAREAN SECTION;  Surgeon: Antionette CharLisa Jackson-Moore, MD;  Location: [facility];  Service: Gynecology;  Laterality: N/A;  Primary Cesarean Section Delivery Boy @ 0240,  Apgars 6/8   Family History  Problem Relation Age of Onset  . Hyperthyroidism Mother   . Asthma Brother   . Hypertension Maternal Grandmother   . Hyperthyroidism Maternal Grandmother    History  Substance Use Topics  . Smoking status: Never Smoker   . Smokeless tobacco: Never Used  . Alcohol Use: No   OB  History    Gravida Para Term Preterm AB TAB SAB Ectopic Multiple Living   1 1 1  0 0 0 0 0 0 0     Review of Systems  Constitutional: Negative for fever and chills.  HENT: Negative for rhinorrhea.   Respiratory: Negative for cough and shortness of breath.   Cardiovascular: Negative for chest pain.  Genitourinary: Negative for dysuria.  Neurological: Negative for dizziness and headaches.  Psychiatric/Behavioral: Negative for suicidal ideas and hallucinations. The patient is not hyperactive.   All other systems reviewed and are negative.     Allergies  Review of patient's allergies indicates no known allergies.  Home Medications   Prior to Admission medications   Medication Sig Start Date End Date Taking? Authorizing Provider  acetaminophen (TYLENOL) 325 MG tablet Take 650 mg by mouth every 6 (six) hours as needed for moderate pain.   Yes Historical Provider, MD  levonorgestrel (MIRENA) 20 MCG/24HR IUD 1 each by Intrauterine route once. 04/24/2012   Yes Historical Provider, MD  hydrOXYzine (ATARAX/VISTARIL) 25 MG tablet Take 1 tablet (25 mg total) by mouth every 6 (six) hours as needed. 10/10/14   Earley FavorGail Katrinka Herbison, NP   BP 122/83 mmHg  Pulse 55  Temp(Src) 98.2 F (36.8 C) (Oral)  Resp 14  SpO2 99%  LMP  (LMP Unknown) Physical Exam  Constitutional: She is oriented to person, place, and time. She appears well-developed and well-nourished.  HENT:  Head: Normocephalic.  Eyes: Pupils are equal, round, and reactive to light.  Neck: Normal range  of motion.  Cardiovascular: Normal rate.   Pulmonary/Chest: Effort normal.  Musculoskeletal: Normal range of motion.  Neurological: She is alert and oriented to person, place, and time.  Skin: Skin is warm.  Psychiatric: Her mood appears anxious. Her speech is delayed. She is withdrawn. Cognition and memory are normal. She expresses inappropriate judgment. She exhibits a depressed mood. She expresses no homicidal and no suicidal ideation. She  expresses no suicidal plans and no homicidal plans.  Nursing note and vitals reviewed.   ED Course  Procedures (including critical care time) Labs Review Labs Reviewed  COMPREHENSIVE METABOLIC PANEL - Abnormal; Notable for the following:    Glucose, Bld 101 (*)    ALT 13 (*)    All other components within normal limits  URINALYSIS, ROUTINE W REFLEX MICROSCOPIC (NOT AT [facility]) - Abnormal; Notable for the following:    APPearance TURBID (*)    Hgb urine dipstick SMALL (*)    Leukocytes, UA LARGE (*)    All other components within normal limits  URINE MICROSCOPIC-ADD ON - Abnormal; Notable for the following:    Squamous Epithelial / LPF MANY (*)    Bacteria, UA FEW (*)    All other components within normal limits  CBC  CBG MONITORING, ED  POC URINE PREG, ED  CBG MONITORING, ED    Imaging Review No results found.   EKG Interpretation None     Patient was evaluated by to see [place].  She has been given outpatient resources and will be discharged home with a prescription for Atarax is seen to help her anxiety symptoms in the past MDM   Final diagnoses:  Anxiety  Depression         Earley Favor, NP 10/10/14 0245  Tomasita Crumble, MD 10/10/14 [phone]

## 2014-10-27 ENCOUNTER — Encounter (HOSPITAL_COMMUNITY): Payer: Self-pay | Admitting: Emergency Medicine

## 2014-10-27 DIAGNOSIS — Z3202 Encounter for pregnancy test, result negative: Secondary | ICD-10-CM | POA: Diagnosis not present

## 2014-10-27 DIAGNOSIS — R51 Headache: Secondary | ICD-10-CM | POA: Insufficient documentation

## 2014-10-27 DIAGNOSIS — Z8719 Personal history of other diseases of the digestive system: Secondary | ICD-10-CM | POA: Insufficient documentation

## 2014-10-27 MED ORDER — ONDANSETRON 4 MG PO TBDP
8.0000 mg | ORAL_TABLET | Freq: Once | ORAL | Status: AC
  Administered 2014-10-27: 8 mg via ORAL

## 2014-10-27 MED ORDER — ONDANSETRON 4 MG PO TBDP
ORAL_TABLET | ORAL | Status: AC
  Filled 2014-10-27: qty 2

## 2014-10-27 NOTE — ED Notes (Signed)
Pt. reports headache with nausea onset this evening unrelieved by OTC Ibuprofen .

## 2014-10-28 ENCOUNTER — Emergency Department (HOSPITAL_COMMUNITY)
Admission: EM | Admit: 2014-10-28 | Discharge: 2014-10-28 | Disposition: A | Discharge status: Home / Self Care | Payer: Medicaid Other | Attending: Emergency Medicine | Admitting: Emergency Medicine

## 2014-10-28 DIAGNOSIS — R51 Headache: Secondary | ICD-10-CM

## 2014-10-28 DIAGNOSIS — R519 Headache, unspecified: Secondary | ICD-10-CM

## 2014-10-28 LAB — POC URINE PREG, ED: PREG TEST UR: NEGATIVE

## 2014-10-28 LAB — CBC WITH DIFFERENTIAL/PLATELET
BASOS PCT: 0 % (ref 0–1)
Basophils Absolute: 0 10*3/uL (ref 0.0–0.1)
EOS ABS: 0.1 10*3/uL (ref 0.0–0.7)
Eosinophils Relative: 1 % (ref 0–5)
HEMATOCRIT: 37.3 % (ref 36.0–46.0)
Hemoglobin: 13 g/dL (ref 12.0–15.0)
LYMPHS PCT: 48 % — AB (ref 12–46)
Lymphs Abs: 3.2 10*3/uL (ref 0.7–4.0)
MCH: 29.1 pg (ref 26.0–34.0)
MCHC: 34.9 g/dL (ref 30.0–36.0)
MCV: 83.6 fL (ref 78.0–100.0)
Monocytes Absolute: 0.4 10*3/uL (ref 0.1–1.0)
Monocytes Relative: 6 % (ref 3–12)
Neutro Abs: 3 10*3/uL (ref 1.7–7.7)
Neutrophils Relative %: 45 % (ref 43–77)
PLATELETS: 186 10*3/uL (ref 150–400)
RBC: 4.46 MIL/uL (ref 3.87–5.11)
RDW: 12.4 % (ref 11.5–15.5)
WBC: 6.7 10*3/uL (ref 4.0–10.5)

## 2014-10-28 LAB — COMPREHENSIVE METABOLIC PANEL
ALT: 16 U/L (ref 14–54)
AST: 18 U/L (ref 15–41)
Albumin: 4.1 g/dL (ref 3.5–5.0)
Alkaline Phosphatase: 51 U/L (ref 38–126)
Anion gap: 6 (ref 5–15)
BUN: 8 mg/dL (ref 6–20)
CHLORIDE: 104 mmol/L (ref 101–111)
CO2: 28 mmol/L (ref 22–32)
CREATININE: 0.92 mg/dL (ref 0.44–1.00)
Calcium: 9 mg/dL (ref 8.9–10.3)
GFR calc Af Amer: >60 mL/min (ref >60)
GFR calc non Af Amer: >60 mL/min (ref >60)
Glucose, Bld: 97 mg/dL (ref 65–99)
Potassium: 3.1 mmol/L — ABNORMAL LOW (ref 3.5–5.1)
SODIUM: 138 mmol/L (ref 135–145)
TOTAL PROTEIN: 6.8 g/dL (ref 6.5–8.1)
Total Bilirubin: 0.7 mg/dL (ref 0.3–1.2)

## 2014-10-28 NOTE — ED Notes (Signed)
Pt A&Ox4, ambulatory at d/c with steady gait, NAD 

## 2014-10-28 NOTE — Discharge Instructions (Signed)
Please monitor for a return of symptoms. If not relieved with over the counter medications please follow-up with primary care provider for further evaluation and management.

## 2014-10-28 NOTE — ED Provider Notes (Signed)
CSN: [id number]     Arrival date & time 10/27/14  2318 History   First MD Initiated Contact with Patient 10/28/14 [phone]     Chief Complaint  Patient presents with  . Headache   HPI   47 YOF presents with headache today. She reports that she developed a frontal headache this evening. She took ibuprofen at home and proceeded to the ED. She notes that upon arrival she was given Zofran. Pt notes that at the time of room assignment her headache had resolved. She describes it as frontal with no radiation. She denies fever, neck stiffness, trauma, focal neurological deficits, or any other concerning signs or symptoms. Pt has no additional complaints.    Past Medical History  Diagnosis Date  . GERD (gastroesophageal reflux disease)    Past Surgical History  Procedure Laterality Date  . Cesarean section  11/23/2011    Procedure: CESAREAN SECTION;  Surgeon: Antionette Char, MD;  Location: [facility];  Service: Gynecology;  Laterality: N/A;  Primary Cesarean Section Delivery Boy @ 0240,  Apgars 6/8   Family History  Problem Relation Age of Onset  . Hyperthyroidism Mother   . Asthma Brother   . Hypertension Maternal Grandmother   . Hyperthyroidism Maternal Grandmother    History  Substance Use Topics  . Smoking status: Never Smoker   . Smokeless tobacco: Never Used  . Alcohol Use: No   OB History    Gravida Para Term Preterm AB TAB SAB Ectopic Multiple Living   0 0 0 0 0 0 0     Review of Systems  All other systems reviewed and are negative.   Allergies  Review of patient's allergies indicates no known allergies.  Home Medications   Prior to Admission medications   Medication Sig Start Date End Date Taking? Authorizing Provider  acetaminophen (TYLENOL) 325 MG tablet Take 650 mg by mouth every 6 (six) hours as needed for moderate pain.    Historical Provider, MD  hydrOXYzine (ATARAX/VISTARIL) 25 MG tablet Take 1 tablet (25 mg total) by mouth every 6 (six) hours as needed.  10/10/14   Earley Favor, NP  levonorgestrel (MIRENA) 20 MCG/24HR IUD 1 each by Intrauterine route once. 04/24/2012    Historical Provider, MD   BP 110/82 mmHg  Pulse 53  Temp(Src) 97.7 F (36.5 C) (Oral)  Resp 16  Ht  (1.575 m)  Wt 124 lb (56.246 kg)  BMI 22.67 kg/m2  SpO2 100%  LMP 10/25/2014   Physical Exam  Constitutional: She is oriented to person, place, and time. She appears well-developed and well-nourished.  HENT:  Head: Normocephalic and atraumatic.  Eyes: Pupils are equal, round, and reactive to light.  Neck: Normal range of motion. Neck supple. No JVD present. No tracheal deviation present. No thyromegaly present.  Cardiovascular: Normal rate, regular rhythm, normal heart sounds and intact distal pulses.  Exam reveals no gallop and no friction rub.   No murmur heard. Pulmonary/Chest: Effort normal and breath sounds normal. No stridor. No respiratory distress. She has no wheezes. She has no rales. She exhibits no tenderness.  Musculoskeletal: Normal range of motion.  Lymphadenopathy:    She has no cervical adenopathy.  Neurological: She is alert and oriented to person, place, and time. She has normal strength. No cranial nerve deficit or sensory deficit. Coordination and gait normal. GCS eye subscore is 4. GCS verbal subscore is 5. GCS motor subscore is 6.  Skin: Skin is warm and dry.  Psychiatric: She  has a normal mood and affect. Her behavior is normal. Judgment and thought content normal.  Nursing note and vitals reviewed.   ED Course  Procedures (including critical care time) Labs Review Labs Reviewed  CBC WITH DIFFERENTIAL/PLATELET - Abnormal; Notable for the following:    Lymphocytes Relative 48 (*)    All other components within normal limits  COMPREHENSIVE METABOLIC PANEL - Abnormal; Notable for the following:    Potassium 3.1 (*)    All other components within normal limits  POC URINE PREG, ED    Imaging Review No results found.   EKG  Interpretation None      MDM   Final diagnoses:  Headache, unspecified headache type    Labs: Point of care urine pregnant, CBC, CMP- no significant findings  Imaging: None indicated  Consults:  Therapeutics: Zofran  Discharge Meds:   Assessment/Plan: Patient presented with a headache. By the time I had evaluated her her symptoms had 100% resolved, she had no complaints. Patient has no red flags for headache, reports that she started her period today and believes that is the cause of her headache. Patient is encouraged to monitor for return of symptoms, follow-up with her primary care provider if they return, follow up in the emergency room if concerning signs or symptoms present. Patient verbalizes understanding and agreement to today's plan and had no further questions concerns at time of discharge.         Eyvonne Mechanic, PA-C 10/28/14 0109  Linwood Dibbles, MD 10/28/14 2134

## 2015-07-12 ENCOUNTER — Encounter: Payer: Medicaid Other | Admitting: Family Medicine

## 2017-01-04 ENCOUNTER — Other Ambulatory Visit (HOSPITAL_COMMUNITY)
Admission: RE | Admit: 2017-01-04 | Discharge: 2017-01-04 | Disposition: A | Discharge status: Home / Self Care | Payer: Medicaid Other | Source: Ambulatory Visit | Attending: Family Medicine | Admitting: Family Medicine

## 2017-01-04 ENCOUNTER — Encounter: Payer: Self-pay | Admitting: Family Medicine

## 2017-01-04 ENCOUNTER — Ambulatory Visit (INDEPENDENT_AMBULATORY_CARE_PROVIDER_SITE_OTHER): Payer: Medicaid Other | Admitting: Family Medicine

## 2017-01-04 VITALS — BP 110/60 | HR 65 | Temp 98.0°F | Ht 61.75 in | Wt 121.6 lb

## 2017-01-04 DIAGNOSIS — R5383 Other fatigue: Secondary | ICD-10-CM | POA: Diagnosis not present

## 2017-01-04 DIAGNOSIS — Z113 Encounter for screening for infections with a predominantly sexual mode of transmission: Secondary | ICD-10-CM | POA: Diagnosis not present

## 2017-01-04 DIAGNOSIS — Z124 Encounter for screening for malignant neoplasm of cervix: Secondary | ICD-10-CM | POA: Diagnosis present

## 2017-01-04 NOTE — Assessment & Plan Note (Signed)
Reports recently feeling more tired than usual.  Per chart review, prior Hgb stable however low Vit D level of 19 in 2015.  -Does not take vit D supplementation or daily MV -Recommend started multivitamin  -Will recheck vit D, TSH and CBC for possible underlying causes  -Discussed setting a daily sleep routine, decreasing screen time (TV and phone) prior to bed -Encouraged good sleep hygiene; recommended taking warm bath or hot shower prior to sleep may possibly help

## 2017-01-04 NOTE — Progress Notes (Signed)
   Subjective:   Patient ID: Rachel Mcclure    DOB: [birth year], 21 y.o. female   MRN: [id number]  CC: routine physical and pap   HPI: Rachel Mcclure is a 21 y.o. female who presents to clinic today for Pap smear and feeling tired.  Feeling tired -Reports she has been feeling this way ever since her son started school.  She wakes up early around 5:30-6AM to fix his lunch then goes to bed around 10 AM.  Wakes up at lunch again.   -goes to bed around 10:30-11PM but some nights are later than that  -uses phone and tv prior to sleeping -has been told in the past that her vitamin D levels are low on blood work  -does not take MV or supplemental vitamin D  ROS: Denies fevers, chills, nausea, vomiting.  Denies abdominal pain, constipation.   PMH: GERD, panic attacks  Medications reviewed.  Objective:   BP 110/60   Pulse 65   Temp 98 F (36.7 C) (Oral)   Ht 5' 1.75" (1.568 m)   Wt 121 lb 9.6 oz (55.2 kg)   LMP 12/05/2016 (Approximate)   SpO2 98%   BMI 22.42 kg/m  Vitals and nursing note reviewed.  General: 21 yo female, NAD  HEENT: NCAT, MMM  Neck: supple CV: RRR no MRG, palpable pulses  Lungs: CTAB, non-labored  Abdomen: soft, NTND, +BS  GU: Pelvic exam: normal external genitalia, vulva, vagina, cervix, uterus and adnexa.  No discharge noted.  No CMT.  Skin: warm, dry, no rashes  Extremities: warm and well perfused   Assessment & Plan:   Feeling tired Reports recently feeling more tired than usual.  Per chart review, prior Hgb stable however low Vit D level of 19 in 2015.  -Does not take vit D supplementation or daily MV -Recommend started multivitamin  -Will recheck vit D, TSH and CBC for possible underlying causes  -Discussed setting a daily sleep routine, decreasing screen time (TV and phone) prior to bed -Encouraged good sleep hygiene; recommended taking warm bath or hot shower prior to sleep may possibly help  Orders Placed This Encounter  Procedures  . CBC    . TSH  . VITAMIN D 25 Hydroxy (Vit-D Deficiency, Fractures)  . HIV antibody (with reflex)   Follow up: PRN   Freddrick March, MD [facility], PGY-2 01/04/2017 4:43 PM

## 2017-01-04 NOTE — Patient Instructions (Signed)
It was nice seeing you again. You were seen in clinic for your Pap smear today and will call you with the results.   For your feelings of tiredness, I have ordered some blood work (vitamin D, hemoglobin and thyroid) and will let you know what the results of these are as well once I have them.    Be well,   Freddrick March, MD

## 2017-01-05 LAB — CBC
Hematocrit: 36.4 % (ref 34.0–46.6)
Hemoglobin: 12.7 g/dL (ref 11.1–15.9)
MCH: 29.1 pg (ref 26.6–33.0)
MCHC: 34.9 g/dL (ref 31.5–35.7)
MCV: 83 fL (ref 79–97)
Platelets: 186 10*3/uL (ref 150–379)
RBC: 4.37 x10E6/uL (ref 3.77–5.28)
RDW: 13.1 % (ref 12.3–15.4)
WBC: 5.9 10*3/uL (ref 3.4–10.8)

## 2017-01-05 LAB — TSH: TSH: 1.12 u[IU]/mL (ref 0.450–4.500)

## 2017-01-05 LAB — HIV ANTIBODY (ROUTINE TESTING W REFLEX): HIV Screen 4th Generation wRfx: NONREACTIVE

## 2017-01-05 LAB — VITAMIN D 25 HYDROXY (VIT D DEFICIENCY, FRACTURES): Vit D, 25-Hydroxy: 26.1 ng/mL — ABNORMAL LOW (ref 30.0–100.0)

## 2017-01-08 LAB — CYTOLOGY - PAP: Diagnosis: NEGATIVE

## 2017-01-08 LAB — CERVICOVAGINAL ANCILLARY ONLY
Chlamydia: NEGATIVE
Neisseria Gonorrhea: NEGATIVE
TRICH (WINDOWPATH): NEGATIVE

## 2017-04-19 ENCOUNTER — Ambulatory Visit (INDEPENDENT_AMBULATORY_CARE_PROVIDER_SITE_OTHER): Payer: Medicaid Other | Admitting: Obstetrics

## 2017-04-19 ENCOUNTER — Encounter: Payer: Self-pay | Admitting: Obstetrics

## 2017-04-19 VITALS — BP 126/81 | HR 67 | Wt 120.8 lb

## 2017-04-19 DIAGNOSIS — Z113 Encounter for screening for infections with a predominantly sexual mode of transmission: Secondary | ICD-10-CM

## 2017-04-19 DIAGNOSIS — Z30430 Encounter for insertion of intrauterine contraceptive device: Secondary | ICD-10-CM | POA: Diagnosis not present

## 2017-04-19 NOTE — Progress Notes (Signed)
     GYNECOLOGY OFFICE PROCEDURE NOTE  Rachel Mcclure is a 22 y.o. G1P1001 here for Miyrena IUD removal. No GYN concerns.  Last pap smear was on 04-19-2017 and was normal.  IUD Removal  Patient identified, informed consent performed, consent signed.  Patient was in the dorsal lithotomy position, normal external genitalia was noted.  A speculum was placed in the patient's vagina, normal discharge was noted, no lesions. The cervix was visualized, no lesions, no abnormal discharge.  The strings of the IUD were grasped and pulled using ring forceps. The IUD was removed in its entirety.  Patient tolerated the procedure well.    Patient desires reinsertion on new Mirena IUD for contraception. ( see note )  Brock BadHARLES A. Jestin Burbach MD Obstetrician & Gynecologist, [facility], [facility]    IUD INSERTION PROCEDURE NOTE:  DIAGNOSIS: Desires long-term, reversible contraception ( LARC )  PROCEDURE: IUD placement Performing Provider: Brock BadHARLES A. Daichi Moris MD Patient counseled prior to procedure. I explained risks and benefits of Mirena IUD, reviewed alternative forms of contraception. Patient stated understanding and consented to continue with procedure.   LMP: 04-19-2017 Pregnancy Test: Negative Lot #: [id number] Expiration Date: May 2021   IUD type: [X]  Mirena   []  Paragard  []  Lyletta   []   Kyleena  PROCEDURE:  Timeout procedure was performed to ensure right patient and right site.  A bimanual exam was performed to determine the position of the uterus, neutral. The speculum was placed. The vagina and cervix was sterilized in the usual manner and sterile technique was maintained throughout the course of the procedure. A single toothed tenaculum was applied to the posterior lip of the cervix and gentle traction applied. The depth of the uterus was sounded to 7 cm. With gentle traction on the tenaculum, the IUD was inserted to the appropriate depth and  inserted without difficulty.  The string was cut to an estimated 4 cm length. Bleeding was minimal. The patient tolerated the procedure well.   Follow up: The patient tolerated the procedure well without complications.  Standard post-procedure care is explained and return precautions are given.  Brock BadHARLES A. Jyasia Markoff MD

## 2017-04-26 ENCOUNTER — Encounter: Payer: Self-pay | Admitting: Obstetrics

## 2017-04-28 ENCOUNTER — Other Ambulatory Visit: Payer: Self-pay

## 2017-04-28 ENCOUNTER — Emergency Department (HOSPITAL_COMMUNITY)
Admission: EM | Admit: 2017-04-28 | Discharge: 2017-04-28 | Disposition: A | Discharge status: Home / Self Care | Payer: Medicaid Other | Attending: Emergency Medicine | Admitting: Emergency Medicine

## 2017-04-28 ENCOUNTER — Encounter (HOSPITAL_COMMUNITY): Payer: Self-pay | Admitting: Emergency Medicine

## 2017-04-28 DIAGNOSIS — R51 Headache: Secondary | ICD-10-CM | POA: Insufficient documentation

## 2017-04-28 DIAGNOSIS — R519 Headache, unspecified: Secondary | ICD-10-CM

## 2017-04-28 LAB — I-STAT BETA HCG BLOOD, ED (MC, WL, AP ONLY): I-stat hCG, quantitative: <5 m[IU]/mL (ref <5)

## 2017-04-28 MED ORDER — KETOROLAC TROMETHAMINE 15 MG/ML IJ SOLN
15.0000 mg | Freq: Once | INTRAMUSCULAR | Status: AC
  Administered 2017-04-28: 15 mg via INTRAVENOUS
  Filled 2017-04-28: qty 1

## 2017-04-28 MED ORDER — METOCLOPRAMIDE HCL 5 MG/ML IJ SOLN
10.0000 mg | Freq: Once | INTRAMUSCULAR | Status: AC
  Administered 2017-04-28: 10 mg via INTRAVENOUS
  Filled 2017-04-28: qty 2

## 2017-04-28 MED ORDER — SODIUM CHLORIDE 0.9 % IV BOLUS (SEPSIS)
1000.0000 mL | Freq: Once | INTRAVENOUS | Status: AC
  Administered 2017-04-28: 1000 mL via INTRAVENOUS

## 2017-04-28 MED ORDER — DIPHENHYDRAMINE HCL 50 MG/ML IJ SOLN
12.5000 mg | Freq: Once | INTRAMUSCULAR | Status: AC
  Administered 2017-04-28: 12.5 mg via INTRAVENOUS
  Filled 2017-04-28: qty 1

## 2017-04-28 NOTE — ED Triage Notes (Signed)
Pt to ED with c/o headache x's 1 week with numb feeling on left side of face.  Pt st's she has headaches but has never been dx with migraines.  Pt alert and oriented x's 3.  Neuro exam neg at this time

## 2017-04-28 NOTE — ED Provider Notes (Signed)
[facility] Provider Note   CSN: [id number] Arrival date & time: 04/28/17  1453     History   Chief Complaint Chief Complaint  Patient presents with  . Headache    HPI Rachel Mcclure is a 22 y.o. female.  HPI   22 year old female with headache.  Started about a week ago.  Initially waxed and waned but is been constant for the past few days now.  Pain is worse around the left temporal region and retro-orbitally.  No acute visual changes.  No fevers or chills.  No neck pain or neck stiffness.  No acute numbness, tingling or focal loss of strength.  She denies any recent trauma.  She reports that she has had similar headaches previously.  She states that [facility].  She does not take any prescribed medications for headaches though.  She has tried taking ibuprofen recently with some mild improvement.  Past Medical History:  Diagnosis Date  . GERD (gastroesophageal reflux disease)     Patient Active Problem List   Diagnosis Date Noted  . Feeling tired 01/04/2017  . Panic attack as reaction to stress 02/17/2014  . GERD (gastroesophageal reflux disease) 01/23/2014    Past Surgical History:  Procedure Laterality Date  . CESAREAN SECTION  11/23/2011   Procedure: CESAREAN SECTION;  Surgeon: Antionette CharLisa Jackson-Moore, MD;  Location: [facility] OR[facility];  Service: Gynecology;  Laterality: N/A;  Primary Cesarean Section Delivery Boy @ 0240,  Apgars 6/8    OB History    Gravida Para Term Preterm AB Living   1 1 1  0 0 1   SAB TAB Ectopic Multiple Live Births   0 0 0 0 1       Home Medications    Prior to Admission medications   Medication Sig Start Date End Date Taking? Authorizing Provider  ibuprofen (ADVIL,MOTRIN) 200 MG tablet Take 400 mg by mouth every 6 (six) hours as needed for headache or mild pain.   Yes [provider]  levonorgestrel (MIRENA) 20 MCG/24HR IUD 1 each by Intrauterine route once. 04/24/2012    Yes [provider]    Family History Family History  Problem Relation Age of Onset  . Hyperthyroidism Mother   . Asthma Brother   . Hypertension Maternal Grandmother   . Hyperthyroidism Maternal Grandmother     Social History Social History   Tobacco Use  . Smoking status: Never Smoker  . Smokeless tobacco: Never Used  Substance Use Topics  . Alcohol use: No  . Drug use: No     Allergies   Patient has no known allergies.   Review of Systems Review of Systems  All systems reviewed and negative, other than as noted in HPI.  Physical Exam Updated Vital Signs BP 114/77 (BP Location: Right Arm)   Pulse 64   Temp 97.7 F (36.5 C) (Oral)   Resp 16   Ht 5\' 2"  (1.575 m)   Wt 54.4 kg (120 lb)   LMP 04/18/2017   SpO2 100%   BMI 21.95 kg/m   Physical Exam  Constitutional: She is oriented to person, place, and time. She appears well-developed and well-nourished. No distress.  HENT:  Head: Normocephalic and atraumatic.  Eyes: Conjunctivae are normal. Right eye exhibits no discharge. Left eye exhibits no discharge.  Neck: Neck supple.  Cardiovascular: Normal rate, regular rhythm and normal heart sounds. Exam reveals no gallop and no friction rub.  No murmur heard. Pulmonary/Chest: Effort  normal and breath sounds normal. No respiratory distress.  Abdominal: Soft. She exhibits no distension. There is no tenderness.  Musculoskeletal: She exhibits no edema or tenderness.  Neurological: She is alert and oriented to person, place, and time.  Speech clear.  Content appropriate.  Follows commands.  Cranial nerves II through XII are intact.  Strength is 5 out of 5 bilateral upper and lower extremities.  Good finger-nose testing bilaterally.  Skin: Skin is warm and dry.  Psychiatric: She has a normal mood and affect. Her behavior is normal. Thought content normal.  Nursing note and vitals reviewed.    ED Treatments / Results  Labs (all labs ordered are listed,  but only abnormal results are displayed) Labs Reviewed  I-STAT BETA HCG BLOOD, ED ([facility], [facility], [facility] ONLY)    EKG  EKG Interpretation None       Radiology No results found.  Procedures Procedures (including critical care time)  Medications Ordered in ED Medications - No data to display   Initial Impression / Assessment and Plan / ED Course  I have reviewed the triage vital signs and the nursing notes.  Pertinent labs & imaging results that were available during my care of the patient were reviewed by me and considered in my medical decision making (see chart for details).     22 year old female with atraumatic headache.  No particular concerning red flags.  Neuro exam is nonfocal.  Doubt meningitis, bleed, venous sinus thrombosis, or other emergent pathology.  Plan symptom medic treatment.  Final Clinical Impressions(s) / ED Diagnoses   Final diagnoses:  Acute nonintractable headache, unspecified headache type    ED Discharge Orders    None       Raeford Razor, MD 04/29/17 2016

## 2017-05-17 ENCOUNTER — Ambulatory Visit (INDEPENDENT_AMBULATORY_CARE_PROVIDER_SITE_OTHER): Payer: Medicaid Other | Admitting: Obstetrics

## 2017-05-17 ENCOUNTER — Encounter: Payer: Self-pay | Admitting: Obstetrics

## 2017-05-17 VITALS — BP 121/86 | HR 71 | Wt 117.0 lb

## 2017-05-17 DIAGNOSIS — Z0000 Encounter for general adult medical examination without abnormal findings: Secondary | ICD-10-CM

## 2017-05-17 DIAGNOSIS — Z30431 Encounter for routine checking of intrauterine contraceptive device: Secondary | ICD-10-CM | POA: Diagnosis not present

## 2017-05-17 DIAGNOSIS — R51 Headache: Secondary | ICD-10-CM | POA: Diagnosis not present

## 2017-05-17 DIAGNOSIS — R519 Headache, unspecified: Secondary | ICD-10-CM

## 2017-05-17 DIAGNOSIS — N944 Primary dysmenorrhea: Secondary | ICD-10-CM

## 2017-05-17 MED ORDER — VITAFOL ULTRA 29-0.6-0.4-200 MG PO CAPS: 1.0000 | ORAL_CAPSULE | Freq: Every day | ORAL | 11 refills | Status: DC

## 2017-05-17 MED ORDER — IBUPROFEN 800 MG PO TABS: 800.0000 mg | ORAL_TABLET | Freq: Three times a day (TID) | ORAL | 5 refills | Status: DC | PRN

## 2017-05-17 MED ORDER — BUTALBITAL-APAP-CAFFEINE 50-325-40 MG PO TABS: 2.0000 | ORAL_TABLET | Freq: Four times a day (QID) | ORAL | 2 refills | Status: AC | PRN

## 2017-05-17 NOTE — Progress Notes (Signed)
Subjective:    Rachel Mcclure  who presents for IUD SURVEILLANCE. The patient has complaints chronic headaches, cramping and spotting. The patient is sexually active. Pertinent past medical history: none.  Likes her MIRENA IUD.    The information documented in the HPI was reviewed and verified.  Menstrual History: OB History    Gravida Para Term Preterm AB Living   1 1 1     1    SAB TAB Ectopic Multiple Live Births         0 1      No LMP recorded. has MIRENA IUD   Patient Active Problem List   Diagnosis Date Noted   Patient Active Problem List   Diagnosis Date Noted  . Feeling tired 01/04/2017  . Panic attack as reaction to stress 02/17/2014  . GERD (gastroesophageal reflux disease) 01/23/2014    No past medical history on file.  Past Surgical History:  Procedure Laterality Date   Past Surgical History:  Procedure Laterality Date  . CESAREAN SECTION  11/23/2011   Procedure: CESAREAN SECTION;  Surgeon: Antionette CharLisa Jackson-Moore, MD;  Location: [facility];  Service: Gynecology;  Laterality: N/A;  Primary Cesarean Section Delivery Boy @ 0240,  Apgars 6/8      Current Outpatient Prescriptions:  No medication comments found. Current Outpatient Medications on File Prior to Visit  Medication Sig Dispense Refill  . ibuprofen (ADVIL,MOTRIN) 200 MG tablet Take 400 mg by mouth every 6 (six) hours as needed for headache or mild pain.    Marland Kitchen. levonorgestrel (MIRENA) 20 MCG/24HR IUD 1 each by Intrauterine route once. 04/24/2012     No current facility-administered medications on file prior to visit.     Allergies  Allergen Reactions  No Known Allergies   Social History  Substance Use Topics   Social History   Socioeconomic History  . Marital status: Single    Spouse name: Not on file  . Number of children: Not on file  . Years of education: Not on file  . Highest education level: Not on file  Social Needs  . Financial resource strain: Not on file  . Food insecurity - worry: Not  on file  . Food insecurity - inability: Not on file  . Transportation needs - medical: Not on file  . Transportation needs - non-medical: Not on file  Occupational History  . Not on file  Tobacco Use  . Smoking status: Never Smoker  . Smokeless tobacco: Never Used  Substance and Sexual Activity  . Alcohol use: No  . Drug use: No  . Sexual activity: Yes    Partners: Male    Birth control/protection: IUD  Other Topics Concern  . Not on file  Social History Narrative  . Not on file     No family history on file.     Review of Systems Constitutional: negative for weight loss Genitourinary:negative for abnormal menstrual periods and vaginal discharge   Objective:   BP 115/77   Pulse 89   Wt 161 lb (73 kg)   BMI 25.22 kg/m    General:   alert  Skin:   no rash or abnormalities  Lungs:   clear to auscultation bilaterally  Heart:   regular rate and rhythm, S1, S2 normal, no murmur, click, rub or gallop  Breasts:   deferred  Abdomen:  normal findings: no organomegaly, soft, non-tender and no hernia  Pelvis:  External genitalia: normal general appearance Urinary system: urethral meatus normal and bladder without fullness, nontender Vaginal:  normal without tenderness, induration or masses Cervix: normal appearance, IUD strings present Adnexa: normal bimanual exam Uterus: anteverted and non-tender, normal size   Lab Review Urine pregnancy test Labs reviewed yes Radiologic studies reviewed no  50% of 15 min visit spent on counseling and coordination of care.    Assessment:    22 y.o., continuing MIRENA IUD, no contraindications.   Plan:    All questions answered.   Follow up in December for Annual    Brock Bad MD

## 2017-05-17 NOTE — Progress Notes (Signed)
RGYN patient presents for IUD CHECK.  Inserted: 04/19/2017 Mirena  LMP: 05/16/17  C/O: Headaches and off and on spotting.

## 2017-07-24 NOTE — Progress Notes (Signed)
   Subjective   Patient ID: Rachel Mcclure Denley    DOB: 11-07-1995, 22 y.o. female   MRN: [id number]  CC: " Eyes are hurting"  HPI: Rachel Mcclure Hinds is a 22 y.o. female who presents for a same day appointment for the following:  ALLERGIES  Onset: day 3 Seasonal symptoms: itching and watery eyes, scatchy throat, runny nose Nasal discharge: clear Medications tried: Zyrtec, Red eyes OTC  Symptoms Sneezing: yes Scratchy throat: yes Fever: no Headache or face pain: yes Tooth pain: no Sore throat or swollen glands: yes Severe fatigue: no Stiff neck: no Shortness of breath: no Rash: no  Of note, patient has a cat for the past 6 months. Not sure if cat is related. She thinks the cat may have caused symptoms because it spends afternoons outside and people were cutting grass.  ROS: see HPI for pertinent.  PMFSH: GERD, panic attacks.  Surgical history C-section.  Family history asthma, hyperthyroidism.  Smoking status reviewed. Medications reviewed.  Objective   BP 98/62   Pulse (!) 55   Temp 97.8 F (36.6 C) (Oral)   Wt 124 lb 3.2 oz (56.3 kg)   SpO2 99%   BMI 22.72 kg/Mcclure  Vitals and nursing note reviewed.  General: well nourished, well developed, NAD with non-toxic appearance HEENT: normocephalic, atraumatic, moist mucous membranes, PERRLA, EOMI, red conjunctiva without purulence, postnasal drip present, nares without active discharge or edema, there is mild edema on the inferior surface of the orbit bilaterally worse on left Neck: supple, non-tender without lymphadenopathy Cardiovascular: regular rate and rhythm without murmurs, rubs, or gallops Lungs: clear to auscultation bilaterally with normal work of breathing Skin: warm, dry, no rashes or lesions, cap refill < 2 seconds Extremities: warm and well perfused, normal tone, no edema  Assessment & Plan   Allergic rhinitis Acute.  Uncertain etiology though suspect this may be related to seasonal pollen or grass.  Patient  does have a cat that his symptoms seem to be delayed. - Instructed patient to avoid rubbing eyes and use cold compress and over-the-counter eyedrops - Flonase 2 sprays in both nares daily - Advised patient to avoid irritant exposure  No orders of the defined types were placed in this encounter.  No orders of the defined types were placed in this encounter.   Durward Parcelavid Kevonna Nolte, DO [facility], PGY-2 07/25/2017, 1:57 PM

## 2017-07-25 ENCOUNTER — Ambulatory Visit (INDEPENDENT_AMBULATORY_CARE_PROVIDER_SITE_OTHER): Payer: Medicaid Other | Admitting: Family Medicine

## 2017-07-25 ENCOUNTER — Encounter: Payer: Self-pay | Admitting: Family Medicine

## 2017-07-25 VITALS — BP 98/62 | HR 55 | Temp 97.8°F | Wt 124.2 lb

## 2017-07-25 DIAGNOSIS — J302 Other seasonal allergic rhinitis: Secondary | ICD-10-CM | POA: Diagnosis not present

## 2017-07-25 DIAGNOSIS — J309 Allergic rhinitis, unspecified: Secondary | ICD-10-CM | POA: Insufficient documentation

## 2017-07-25 MED ORDER — FLUTICASONE PROPIONATE 50 MCG/ACT NA SUSP: 2.0000 | Freq: Every day | NASAL | 6 refills | Status: DC

## 2017-07-25 NOTE — Patient Instructions (Signed)
Thank you for coming in to see us today. Please see below to review our plan for today's visit.  I sent in a prescription for a intranasal steroid called Flonase.  Take 2 puffs in each nostril daily.  This will take several days to work.  You should see symptom relief over the next week.  It is important that you avoid rubbing her eyes.  Use cold compress to the eyes as needed.  You can take over-the-counter antihistamines like Zyrtec or Claritin.  Benadryl is another option though this may cause drowsiness.  Please call the clinic at [phone] if your symptoms worsen or you have any concerns. It was our pleasure to serve you.  Durward Parcelavid McMullen, DO [facility], PGY-2

## 2017-07-25 NOTE — Assessment & Plan Note (Signed)
Acute.  Uncertain etiology though suspect this may be related to seasonal pollen or grass.  Patient does have a cat that his symptoms seem to be delayed. - Instructed patient to avoid rubbing eyes and use cold compress and over-the-counter eyedrops - Flonase 2 sprays in both nares daily - Advised patient to avoid irritant exposure

## 2017-07-26 ENCOUNTER — Telehealth: Payer: Self-pay | Admitting: Family Medicine

## 2017-07-26 NOTE — Telephone Encounter (Signed)
Pt mother called and said her daughters eye is worse than it was yesterday. She said it is red and really swollen. She would like a referral to an eye dr.

## 2017-07-27 ENCOUNTER — Telehealth: Payer: Self-pay | Admitting: Family Medicine

## 2017-07-27 NOTE — Telephone Encounter (Signed)
Contacted patient regarding follow-up for left eye itching and swelling.  No answer, left voicemail.  Instructed patient to continue applying warm compress and to avoid touching her eye.  Reviewed return precautions.  Advised patient to contact clinic if swelling is worsening.    Durward Parcelavid Kaislyn Gulas, DO [facility], PGY-2

## 2017-07-27 NOTE — Telephone Encounter (Signed)
Forwarding to the provider who saw her on 4/24, thanks

## 2017-08-07 ENCOUNTER — Other Ambulatory Visit: Payer: Self-pay

## 2017-08-07 ENCOUNTER — Ambulatory Visit (INDEPENDENT_AMBULATORY_CARE_PROVIDER_SITE_OTHER): Payer: Medicaid Other | Admitting: Family Medicine

## 2017-08-07 DIAGNOSIS — R05 Cough: Secondary | ICD-10-CM | POA: Diagnosis not present

## 2017-08-07 DIAGNOSIS — R059 Cough, unspecified: Secondary | ICD-10-CM

## 2017-08-07 MED ORDER — LORATADINE 10 MG PO TABS: 10.0000 mg | ORAL_TABLET | Freq: Every day | ORAL | 11 refills | Status: DC

## 2017-08-07 NOTE — Patient Instructions (Signed)
Thank you for coming to see me today. It was a pleasure! Today we talked about:   Cough: You can continue the zyrtec  daily or try Claritin daily. You may also use honey to help with the cough. 2 tbsp every 4 hours as needed. You may try budesonide nasal spray with 2 puffs in each nostril twice daily. At night, if you symptoms are worse than normal, you may try benadryl 25 mg before bed. Please do not use this every night, but on nights that your symptoms.   Please follow-up with your primary care doctor as needed.  If you have any questions or concerns, please do not hesitate to call the office at [phone].  Take Care,   [place] Lurene Robley, DO

## 2017-08-10 DIAGNOSIS — R059 Cough, unspecified: Secondary | ICD-10-CM | POA: Insufficient documentation

## 2017-08-10 DIAGNOSIS — R05 Cough: Secondary | ICD-10-CM | POA: Insufficient documentation

## 2017-08-10 NOTE — Progress Notes (Signed)
   Subjective:    Patient ID: Rachel Mcclure, female    DOB: Feb 06, 1996, 22 y.o.   MRN: [id number]   CC: cough  HPI: COUGH  Has been coughing for 2 weeks. Cough is: dry, not worse at night, worse during spring Sputum production: non-productive Medications tried: zyrtec for a few days and flonase but did not like the taste Taking blood pressure medications: no  Symptoms Runny nose: yes Mucous in back of throat: yes Throat burning or reflux: denies this Wheezing or asthma: reports that maybe sometimes, but does not sound like true wheeze Fever: no Chest Pain: no Shortness of breath: no Leg swelling: no Hemoptysis: none Weight loss: none  ROS see HPI Smoking Status noted   Patient Active Problem List   Diagnosis Date Noted  . Cough 08/10/2017  . Allergic rhinitis 07/25/2017  . Feeling tired 01/04/2017  . Panic attack as reaction to stress 02/17/2014  . GERD (gastroesophageal reflux disease) 01/23/2014     Objective:  BP 100/60 (BP Location: Left Arm)   Pulse 76   Temp 98.1 F (36.7 C) (Oral)   Wt 124 lb 3.2 oz (56.3 kg)   SpO2 99%   BMI 22.72 kg/m  Vitals and nursing note reviewed  General: NAD, pleasant HEENT: Atraumatic. Normocephalic. Normal oropharynx without erythema, lesions, exudate. Turbinates with no erythema. Neck: No cervical lymphadenopathy.  Cardiac: RRR, no m/r/g Respiratory: CTAB, normal work of breathing Skin: warm and dry, no rashes noted Neuro: alert and oriented   Assessment & Plan:    Cough Likely due to post nasal drip per history. Patient instructed to continue the zyrtec  daily or try Claritin daily. Also told she may also use honey to help with the cough. 2 tbsp every 4 hours as needed. Patient instructed to try budesonide nasal spray with 2 puffs in each nostril twice daily.   At night, if symptoms are worse than normal, you may try benadryl 25 mg before bed. Please do not use this every night, but on nights that your  symptoms are worse than usual.     [place] Daniele Yankowski, DO Family Medicine Resident PGY-1

## 2017-08-10 NOTE — Assessment & Plan Note (Signed)
Likely due to post nasal drip per history. Patient instructed to continue the zyrtec  daily or try Claritin daily. Also told she may also use honey to help with the cough. 2 tbsp every 4 hours as needed. Patient instructed to try budesonide nasal spray with 2 puffs in each nostril twice daily.   At night, if symptoms are worse than normal, you may try benadryl 25 mg before bed. Please do not use this every night, but on nights that your symptoms are worse than usual.

## 2017-12-20 DIAGNOSIS — H00025 Hordeolum internum left lower eyelid: Secondary | ICD-10-CM | POA: Diagnosis not present

## 2018-02-19 DIAGNOSIS — R509 Fever, unspecified: Secondary | ICD-10-CM | POA: Diagnosis not present

## 2018-02-19 DIAGNOSIS — J029 Acute pharyngitis, unspecified: Secondary | ICD-10-CM | POA: Diagnosis not present

## 2018-02-19 DIAGNOSIS — J22 Unspecified acute lower respiratory infection: Secondary | ICD-10-CM | POA: Diagnosis not present

## 2018-02-19 DIAGNOSIS — G43909 Migraine, unspecified, not intractable, without status migrainosus: Secondary | ICD-10-CM | POA: Diagnosis not present

## 2018-02-19 DIAGNOSIS — F411 Generalized anxiety disorder: Secondary | ICD-10-CM | POA: Diagnosis not present

## 2018-02-20 ENCOUNTER — Ambulatory Visit: Payer: Medicaid Other

## 2018-02-20 ENCOUNTER — Ambulatory Visit: Payer: Medicaid Other | Admitting: Family Medicine

## 2018-03-05 DIAGNOSIS — G43009 Migraine without aura, not intractable, without status migrainosus: Secondary | ICD-10-CM | POA: Diagnosis not present

## 2018-03-05 DIAGNOSIS — N912 Amenorrhea, unspecified: Secondary | ICD-10-CM | POA: Diagnosis not present

## 2018-03-05 DIAGNOSIS — F411 Generalized anxiety disorder: Secondary | ICD-10-CM | POA: Diagnosis not present

## 2018-03-18 ENCOUNTER — Other Ambulatory Visit (HOSPITAL_COMMUNITY)
Admission: RE | Admit: 2018-03-18 | Discharge: 2018-03-18 | Disposition: A | Discharge status: Home / Self Care | Payer: Medicaid Other | Source: Ambulatory Visit | Attending: Obstetrics | Admitting: Obstetrics

## 2018-03-18 ENCOUNTER — Encounter: Payer: Self-pay | Admitting: Obstetrics

## 2018-03-18 ENCOUNTER — Ambulatory Visit (INDEPENDENT_AMBULATORY_CARE_PROVIDER_SITE_OTHER): Payer: Medicaid Other | Admitting: Obstetrics

## 2018-03-18 ENCOUNTER — Other Ambulatory Visit: Payer: Self-pay

## 2018-03-18 VITALS — BP 99/75 | HR 51 | Temp 97.5°F | Ht 61.0 in | Wt 126.0 lb

## 2018-03-18 DIAGNOSIS — Z0000 Encounter for general adult medical examination without abnormal findings: Secondary | ICD-10-CM

## 2018-03-18 DIAGNOSIS — N898 Other specified noninflammatory disorders of vagina: Secondary | ICD-10-CM

## 2018-03-18 DIAGNOSIS — Z01419 Encounter for gynecological examination (general) (routine) without abnormal findings: Secondary | ICD-10-CM | POA: Diagnosis not present

## 2018-03-18 DIAGNOSIS — Z975 Presence of (intrauterine) contraceptive device: Secondary | ICD-10-CM

## 2018-03-18 NOTE — Progress Notes (Signed)
Subjective:        Rachel Mcclure is a 22 y.o. female here for a routine exam.  Current complaints: None.    Personal health questionnaire:  Is patient Ashkenazi Jewish, have a family history of breast and/or ovarian cancer: no Is there a family history of uterine cancer diagnosed at age < 73, gastrointestinal cancer, urinary tract cancer, family member who is a Personnel officer syndrome-associated carrier: no Is the patient overweight and hypertensive, family history of diabetes, personal history of gestational diabetes, preeclampsia or PCOS: no Is patient over 32, have PCOS,  family history of premature CHD under age 7, diabetes, smoke, have hypertension or peripheral artery disease:  no At any time, has a partner hit, kicked or otherwise hurt or frightened you?: no Over the past 2 weeks, have you felt down, depressed or hopeless?: no Over the past 2 weeks, have you felt little interest or pleasure in doing things?:no   Gynecologic History Patient's last menstrual period was 03/14/2018 (exact date). Contraception: IUD Last Pap: 2018. Results were: normal Last mammogram: n/a. Results were: n/a  Obstetric History OB History  Gravida Para Term Preterm AB Living  1 1 1  0 0 1  SAB TAB Ectopic Multiple Live Births  0 0 0 0 1    # Outcome Date GA Lbr Len/2nd Weight Sex Delivery Anes PTL Lv  1 Term 11/23/11 [redacted]w[redacted]d 19:15 / 04:25  M CS-LTranv EPI      Past Medical History:  Diagnosis Date  . GERD (gastroesophageal reflux disease)     Past Surgical History:  Procedure Laterality Date  . CESAREAN SECTION  11/23/2011   Procedure: CESAREAN SECTION;  Surgeon: Antionette Char, MD;  Location: [facility];  Service: Gynecology;  Laterality: N/A;  Primary Cesarean Section Delivery Boy @ 0240,  Apgars 6/8     Current Outpatient Medications:  .  levonorgestrel (MIRENA) 20 MCG/24HR IUD, 1 each by Intrauterine route once. 04/24/2012, Disp: , Rfl:  .  sertraline (ZOLOFT) 25 MG tablet, Take 25 mg by  mouth daily., Disp: , Rfl:  .  butalbital-acetaminophen-caffeine (FIORICET, ESGIC) 50-325-40 MG tablet, Take 2 tablets by mouth every 6 (six) hours as needed for headache. (Patient not taking: Reported on 07/25/2017), Disp: 40 tablet, Rfl: 2 .  fluticasone (FLONASE) 50 MCG/ACT nasal spray, Place 2 sprays into both nostrils daily. (Patient not taking: Reported on 03/18/2018), Disp: 16 g, Rfl: 6 .  ibuprofen (ADVIL,MOTRIN) 200 MG tablet, Take 400 mg by mouth every 6 (six) hours as needed for headache or mild pain., Disp: , Rfl:  .  ibuprofen (ADVIL,MOTRIN) 800 MG tablet, Take 1 tablet (800 mg total) by mouth every 8 (eight) hours as needed. (Patient not taking: Reported on 07/25/2017), Disp: 30 tablet, Rfl: 5 .  loratadine (CLARITIN) 10 MG tablet, Take 1 tablet (10 mg total) by mouth daily. (Patient not taking: Reported on 03/18/2018), Disp: 30 tablet, Rfl: 11 .  Prenat-Fe Poly-Methfol-FA-DHA (VITAFOL ULTRA) 29-0.6-0.4-200 MG CAPS, Take 1 capsule by mouth daily before breakfast. (Patient not taking: Reported on 07/25/2017), Disp: 30 capsule, Rfl: 11 No Known Allergies  Social History   Tobacco Use  . Smoking status: Never Smoker  . Smokeless tobacco: Never Used  Substance Use Topics  . Alcohol use: No    Family History  Problem Relation Age of Onset  . Hyperthyroidism Mother   . Asthma Brother   . Hypertension Maternal Grandmother   . Hyperthyroidism Maternal Grandmother       Review of Systems  Constitutional: negative for fatigue and weight loss Respiratory: negative for cough and wheezing Cardiovascular: negative for chest pain, fatigue and palpitations Gastrointestinal: negative for abdominal pain and change in bowel habits Musculoskeletal:negative for myalgias Neurological: negative for gait problems and tremors Behavioral/Psych: negative for abusive relationship, depression Endocrine: negative for temperature intolerance    Genitourinary:negative for abnormal menstrual periods,  genital lesions, hot flashes, sexual problems and vaginal discharge Integument/breast: negative for breast lump, breast tenderness, nipple discharge and skin lesion(s)    Objective:       BP 99/75   Pulse (!) 51   Temp (!) 97.5 F (36.4 C)   Ht 5\' 1"  (1.549 m)   Wt 126 lb (57.2 kg)   LMP 03/14/2018 (Exact Date)   BMI 23.81 kg/m  General:   alert  Skin:   no rash or abnormalities  Lungs:   clear to auscultation bilaterally  Heart:   regular rate and rhythm, S1, S2 normal, no murmur, click, rub or gallop  Breasts:   normal without suspicious masses, skin or nipple changes or axillary nodes  Abdomen:  normal findings: no organomegaly, soft, non-tender and no hernia  Pelvis:  External genitalia: normal general appearance Urinary system: urethral meatus normal and bladder without fullness, nontender Vaginal: normal without tenderness, induration or masses Cervix: normal appearance Adnexa: normal bimanual exam Uterus: anteverted and non-tender, normal size   Lab Review Urine pregnancy test Labs reviewed yes Radiologic studies reviewed no  50% of 20 min visit spent on counseling and coordination of care.   Assessment:     1. Encounter for routine gynecological examination with Papanicolaou smear of cervix Rx: - Cytology - PAP( [place])  2. IUD (intrauterine device) in place - doing well  3. Vaginal discharge Rx: - Cervicovaginal ancillary only( )   Plan:    Education reviewed: calcium supplements, depression evaluation, low fat, low cholesterol diet, safe sex/STD prevention, self breast exams and weight bearing exercise. Contraception: IUD. Follow up in: 1 year.   No orders of the defined types were placed in this encounter.  No orders of the defined types were placed in this encounter.   Brock BadHARLES A.  MD 03-18-2018

## 2018-03-18 NOTE — Progress Notes (Signed)
Presents for AEX. 

## 2018-03-19 DIAGNOSIS — N911 Secondary amenorrhea: Secondary | ICD-10-CM | POA: Diagnosis not present

## 2018-03-19 DIAGNOSIS — R5383 Other fatigue: Secondary | ICD-10-CM | POA: Diagnosis not present

## 2018-03-19 DIAGNOSIS — Z114 Encounter for screening for human immunodeficiency virus [HIV]: Secondary | ICD-10-CM | POA: Diagnosis not present

## 2018-03-19 DIAGNOSIS — Z0000 Encounter for general adult medical examination without abnormal findings: Secondary | ICD-10-CM | POA: Diagnosis not present

## 2018-03-19 DIAGNOSIS — R82998 Other abnormal findings in urine: Secondary | ICD-10-CM | POA: Diagnosis not present

## 2018-03-19 DIAGNOSIS — E559 Vitamin D deficiency, unspecified: Secondary | ICD-10-CM | POA: Diagnosis not present

## 2018-03-19 LAB — CERVICOVAGINAL ANCILLARY ONLY
Chlamydia: NEGATIVE
Neisseria Gonorrhea: NEGATIVE

## 2018-03-20 ENCOUNTER — Other Ambulatory Visit: Payer: Self-pay | Admitting: Physician Assistant

## 2018-03-20 DIAGNOSIS — G43909 Migraine, unspecified, not intractable, without status migrainosus: Secondary | ICD-10-CM

## 2018-03-21 ENCOUNTER — Other Ambulatory Visit: Payer: Self-pay | Admitting: Obstetrics

## 2018-03-21 DIAGNOSIS — B373 Candidiasis of vulva and vagina: Secondary | ICD-10-CM

## 2018-03-21 DIAGNOSIS — B3731 Acute candidiasis of vulva and vagina: Secondary | ICD-10-CM

## 2018-03-21 LAB — CYTOLOGY - PAP: Diagnosis: NEGATIVE

## 2018-03-21 MED ORDER — FLUCONAZOLE 150 MG PO TABS: 150.0000 mg | ORAL_TABLET | Freq: Once | ORAL | 0 refills | Status: AC

## 2018-03-25 ENCOUNTER — Ambulatory Visit
Admission: RE | Admit: 2018-03-25 | Discharge: 2018-03-25 | Disposition: A | Discharge status: Home / Self Care | Payer: Medicaid Other | Source: Ambulatory Visit | Attending: Physician Assistant | Admitting: Physician Assistant

## 2018-03-25 DIAGNOSIS — R51 Headache: Secondary | ICD-10-CM | POA: Diagnosis not present

## 2018-03-25 DIAGNOSIS — G43909 Migraine, unspecified, not intractable, without status migrainosus: Secondary | ICD-10-CM

## 2018-04-02 DIAGNOSIS — J069 Acute upper respiratory infection, unspecified: Secondary | ICD-10-CM | POA: Diagnosis not present

## 2018-04-02 DIAGNOSIS — G43909 Migraine, unspecified, not intractable, without status migrainosus: Secondary | ICD-10-CM | POA: Diagnosis not present

## 2018-04-02 DIAGNOSIS — E559 Vitamin D deficiency, unspecified: Secondary | ICD-10-CM | POA: Diagnosis not present

## 2018-04-02 DIAGNOSIS — E782 Mixed hyperlipidemia: Secondary | ICD-10-CM | POA: Diagnosis not present

## 2018-04-08 DIAGNOSIS — R112 Nausea with vomiting, unspecified: Secondary | ICD-10-CM | POA: Diagnosis not present

## 2018-04-08 DIAGNOSIS — A059 Bacterial foodborne intoxication, unspecified: Secondary | ICD-10-CM | POA: Diagnosis not present

## 2018-04-08 DIAGNOSIS — R51 Headache: Secondary | ICD-10-CM | POA: Diagnosis not present

## 2018-04-08 DIAGNOSIS — E860 Dehydration: Secondary | ICD-10-CM | POA: Diagnosis not present

## 2018-05-27 DIAGNOSIS — G43009 Migraine without aura, not intractable, without status migrainosus: Secondary | ICD-10-CM | POA: Diagnosis not present

## 2018-05-27 DIAGNOSIS — Z3202 Encounter for pregnancy test, result negative: Secondary | ICD-10-CM | POA: Diagnosis not present

## 2018-06-02 DIAGNOSIS — G43909 Migraine, unspecified, not intractable, without status migrainosus: Secondary | ICD-10-CM | POA: Diagnosis not present

## 2018-06-04 ENCOUNTER — Encounter: Payer: Self-pay | Admitting: Neurology

## 2018-07-08 ENCOUNTER — Ambulatory Visit: Payer: Medicaid Other | Admitting: Neurology

## 2018-08-10 DIAGNOSIS — G43109 Migraine with aura, not intractable, without status migrainosus: Secondary | ICD-10-CM | POA: Diagnosis not present

## 2018-08-10 DIAGNOSIS — F329 Major depressive disorder, single episode, unspecified: Secondary | ICD-10-CM | POA: Diagnosis not present

## 2018-09-03 ENCOUNTER — Encounter: Payer: Self-pay | Admitting: Neurology

## 2018-09-11 ENCOUNTER — Ambulatory Visit: Payer: Medicaid Other | Admitting: Neurology

## 2018-09-12 DIAGNOSIS — F411 Generalized anxiety disorder: Secondary | ICD-10-CM | POA: Diagnosis not present

## 2018-09-12 DIAGNOSIS — G43909 Migraine, unspecified, not intractable, without status migrainosus: Secondary | ICD-10-CM | POA: Diagnosis not present

## 2018-09-13 ENCOUNTER — Encounter: Payer: Self-pay | Admitting: Neurology

## 2018-09-13 NOTE — Progress Notes (Signed)
Virtual Visit via Video Note The purpose of this virtual visit is to provide medical care while limiting exposure to the novel coronavirus.    Consent was obtained for video visit:  Yes.   Answered questions that patient had about telehealth interaction:  Yes.   I discussed the limitations, risks, security and privacy concerns of performing an evaluation and management service by telemedicine. I also discussed with the patient that there may be a patient responsible charge related to this service. The patient expressed understanding and agreed to proceed.  Pt location: Home Physician Location: Home Name of referring provider:  Paschal DoppCarroll, Michael J, PA I connected with Rachel PaciniVanessa M Rumer at patients initiation/request on 09/16/2018 at  9:10 AM EDT by video enabled telemedicine application and verified that I am speaking with the correct person using two identifiers. Pt MRN:  [id number] Pt DOB:  1995-05-12 Video Participants:  Rachel Mcclure   History of Present Illness:  Rachel Mcclure is a 23 year old woman who presents for headaches.  History supplemented by ED notes.  Onset:  23 years old Location:  Bifrontal and bridge of nose Quality:  Sharp, aching Intensity:  10/10.  She denies new headache, thunderclap headache Aura:  no Premonitory Phase:  no Postdrome:  no Associated symptoms:  Nausea, vomiting,  photophobia, phonophobia.  Sometimes dizzy.  She reports occasional numbness and tingling of the fingers which she attributes to associated panic attacks.  She denies associated unilateral weakness. Duration:  1 day Frequency:  Once a week Frequency of abortive medication:  infrequent Triggers:  Emotional stress Relieving factors:  Icy patch Activity:  aggravates  She has been to the ED on several occasions regarding her headaches.  CT head without contrast from 03/25/18 was personally reviewed and was normal.  Current NSAIDS:  Ibuprofen (rarely) Current analgesics:  Tylenol  (rarely) Current triptans:  none Current ergotamine:  none Current anti-emetic:  none Current muscle relaxants:  none Current anti-anxiolytic:  none Current sleep aide:  none Current Antihypertensive medications:  none Current Antidepressant medications:  Sertraline 25mg  Current Anticonvulsant medications:  topiramate 50mg  at bedtime Current anti-CGRP:  none Current Vitamins/Herbal/Supplements:  none Current Antihistamines/Decongestants:  none Other therapy:  Icy patch to the forehead Hormone/birth control:  Mirena Other medications:  none  Past NSAIDS:  none Past analgesics:  none Past abortive triptans:  Sumatriptan 100mg  (ineffective) Past abortive ergotamine:  none Past muscle relaxants:  none Past anti-emetic:  none Past antihypertensive medications:  none Past antidepressant medications:  none Past anticonvulsant medications:  none Past anti-CGRP:  none Past vitamins/Herbal/Supplements:  none Past antihistamines/decongestants:  none Other past therapies:  none  Caffeine:  Infrequent coffee.   Sometimes Pepsi Diet:  Hydrates.  Sometimes Sprite and Pepsi Depression and anxiety:  Yes  Past Medical History: Past Medical History:  Diagnosis Date  . GERD (gastroesophageal reflux disease)     Medications: Outpatient Encounter Medications as of 09/16/2018  Medication Sig  . fluticasone (FLONASE) 50 MCG/ACT nasal spray Place 2 sprays into both nostrils daily. (Patient not taking: Reported on 03/18/2018)  . ibuprofen (ADVIL,MOTRIN) 200 MG tablet Take 400 mg by mouth every 6 (six) hours as needed for headache or mild pain.  Marland Kitchen. ibuprofen (ADVIL,MOTRIN) 800 MG tablet Take 1 tablet (800 mg total) by mouth every 8 (eight) hours as needed. (Patient not taking: Reported on 07/25/2017)  . levonorgestrel (MIRENA) 20 MCG/24HR IUD 1 each by Intrauterine route once. 04/24/2012  . loratadine (CLARITIN) 10 MG tablet Take 1 tablet (  10 mg total) by mouth daily. (Patient not taking: Reported  on 03/18/2018)  . Prenat-Fe Poly-Methfol-FA-DHA (VITAFOL ULTRA) 29-0.6-0.4-200 MG CAPS Take 1 capsule by mouth daily before breakfast. (Patient not taking: Reported on 07/25/2017)  . sertraline (ZOLOFT) 25 MG tablet Take 25 mg by mouth daily.   No facility-administered encounter medications on file as of 09/16/2018.     Allergies: No Known Allergies  Family History: Family History  Problem Relation Age of Onset  . Hyperthyroidism Mother   . Asthma Brother   . Hypertension Maternal Grandmother   . Hyperthyroidism Maternal Grandmother     Social History: Social History   Socioeconomic History  . Marital status: Single    Spouse name: Not on file  . Number of children: Not on file  . Years of education: Not on file  . Highest education level: Not on file  Occupational History  . Not on file  Social Needs  . Financial resource strain: Not on file  . Food insecurity    Worry: Not on file    Inability: Not on file  . Transportation needs    Medical: Not on file    Non-medical: Not on file  Tobacco Use  . Smoking status: Never Smoker  . Smokeless tobacco: Never Used  Substance and Sexual Activity  . Alcohol use: No  . Drug use: No  . Sexual activity: Yes    Partners: Male    Birth control/protection: I.U.D.  Lifestyle  . Physical activity    Days per week: Not on file    Minutes per session: Not on file  . Stress: Not on file  Relationships  . Social Herbalist on phone: Not on file    Gets together: Not on file    Attends religious service: Not on file    Active member of club or organization: Not on file    Attends meetings of clubs or organizations: Not on file    Relationship status: Not on file  . Intimate partner violence    Fear of current or ex partner: Not on file    Emotionally abused: Not on file    Physically abused: Not on file    Forced sexual activity: Not on file  Other Topics Concern  . Not on file  Social History Narrative  . Not  on file    Observations/Objective:   There were no vitals filed for this visit. No acute distress.  Alert and oriented.  Speech fluent and not dysarthric.  Language intact.  Face symmetric.  Assessment and Plan:   Migraine without aura, without status migrainosus, not intractable  1.  For preventative management, increase topiramate to 75mg  at bedtime.  We can increase to 100mg  at bedtime in 4 to 6 weeks if needed. 2.  For abortive therapy, she will try rizatriptan 10mg  3.  Limit use of pain relievers to no more than 2 days out of week to prevent risk of rebound or medication-overuse headache. 4.  Keep headache diary 5.  Exercise, hydration, caffeine cessation, sleep hygiene, monitor for and avoid triggers 6.  Consider:  magnesium citrate 400mg  daily, riboflavin 400mg  daily, and coenzyme Q10 100mg  three times daily 7. Always keep in mind that currently taking a hormone or birth control may be a possible trigger or aggravating factor for migraine. 8. Follow up 4 months.   Follow Up Instructions:    -I discussed the assessment and treatment plan with the patient. The patient was provided an  opportunity to ask questions and all were answered. The patient agreed with the plan and demonstrated an understanding of the instructions.   The patient was advised to call back or seek an in-person evaluation if the symptoms worsen or if the condition fails to improve as anticipated.   Cira ServantAdam Robert , DO

## 2018-09-16 ENCOUNTER — Encounter: Payer: Self-pay | Admitting: Neurology

## 2018-09-16 ENCOUNTER — Other Ambulatory Visit: Payer: Self-pay

## 2018-09-16 ENCOUNTER — Ambulatory Visit (INDEPENDENT_AMBULATORY_CARE_PROVIDER_SITE_OTHER): Payer: Medicaid Other | Admitting: Neurology

## 2018-09-16 VITALS — Ht 61.0 in | Wt 131.0 lb

## 2018-09-16 DIAGNOSIS — G43009 Migraine without aura, not intractable, without status migrainosus: Secondary | ICD-10-CM

## 2018-09-16 MED ORDER — RIZATRIPTAN BENZOATE 10 MG PO TABS: ORAL_TABLET | ORAL | 3 refills | Status: DC

## 2018-09-16 MED ORDER — TOPIRAMATE 25 MG PO TABS: 75.0000 mg | ORAL_TABLET | Freq: Every day | ORAL | 3 refills | Status: DC

## 2018-09-19 DIAGNOSIS — L910 Hypertrophic scar: Secondary | ICD-10-CM | POA: Diagnosis not present

## 2018-09-19 DIAGNOSIS — L309 Dermatitis, unspecified: Secondary | ICD-10-CM | POA: Diagnosis not present

## 2019-01-14 NOTE — Progress Notes (Signed)
NEUROLOGY FOLLOW UP OFFICE NOTE  Rachel Mcclure [id number]  HISTORY OF PRESENT ILLNESS: Rachel Mcclure is a 23 year old woman who follows up for migraine.  UPDATE: Overall doing well.  Trying to manage stress. Intensity:  severe Duration:  Unsure.  She goes to sleep and gone when she wakes up Frequency:  1 to 2 times a month. Frequency of abortive medication: 1 to 2 times a month. Rescue protocol:  Maxalt, Icy patch, warm bath Current NSAIDS:  Ibuprofen (rarely) Current analgesics:  Tylenol (rarely) Current triptans:  Maxalt 10mg  Current ergotamine:  none Current anti-emetic:  none Current muscle relaxants:  none Current anti-anxiolytic:  none Current sleep aide:  none Current Antihypertensive medications:  none Current Antidepressant medications:  Sertraline 25mg  Current Anticonvulsant medications:  topiramate 75mg  at bedtime Current anti-CGRP:  none Current Vitamins/Herbal/Supplements:  none Current Antihistamines/Decongestants:  none Other therapy:  Icy patch to the forehead Hormone/birth control:  Mirena Other medications:  none  Caffeine:  Infrequent coffee.   Sometimes Pepsi Diet:  Hydrates.  Sometimes Sprite and Pepsi Depression and anxiety:  Yes  HISTORY: Onset: 23 years old Location:  Bifrontal and bridge of nose Quality:  Sharp, aching Initial Intensity:  10/10.  She denies new headache, thunderclap headache Aura:  no Premonitory Phase:  no Postdrome:  no Associated symptoms:  Nausea, vomiting,  photophobia, phonophobia.  Sometimes dizzy.  She reports occasional numbness and tingling of the fingers which she attributes to associated panic attacks.  She denies associated unilateral weakness. Initial Duration:  1 day Initial Frequency:  Once a week Frequency of abortive medication:  infrequent Triggers:  Emotional stress, maybe menstrual cycle. Relieving factors:  Icy patch Activity:  aggravates  She has been to the ED on several occasions  regarding her headaches.  CT head without contrast from 03/25/18 was personally reviewed and was normal.  Past NSAIDS:  none Past analgesics:  none Past abortive triptans:  Sumatriptan 100mg  (ineffective) Past abortive ergotamine:  none Past muscle relaxants:  none Past anti-emetic:  none Past antihypertensive medications:  none Past antidepressant medications:  none Past anticonvulsant medications:  none Past anti-CGRP:  none Past vitamins/Herbal/Supplements:  none Past antihistamines/decongestants:  none Other past therapies:  none  PAST MEDICAL HISTORY: Past Medical History:  Diagnosis Date  . GERD (gastroesophageal reflux disease)     MEDICATIONS: Current Outpatient Medications on File Prior to Visit  Medication Sig Dispense Refill  . ibuprofen (ADVIL,MOTRIN) 200 MG tablet Take 400 mg by mouth every 6 (six) hours as needed for headache or mild pain.    levonorgestrel (MIRENA) 20 MCG/24HR IUD 1 each by Intrauterine route once. 04/24/2012    . rizatriptan (MAXALT) 10 MG tablet Take 1 tablet earliest onset of migraine.  May repeat in 2 hours if needed.  Maximum 2 tablets in 24 hours 10 tablet 3  . sertraline (ZOLOFT) 25 MG tablet Take 25 mg by mouth daily.    03/27/18 topiramate (TOPAMAX) 25 MG tablet Take 3 tablets (75 mg total) by mouth at bedtime. 90 tablet 3   No current facility-administered medications on file prior to visit.     ALLERGIES: No Known Allergies  FAMILY HISTORY: Family History  Problem Relation Age of Onset  . Hyperthyroidism Mother   . Asthma Brother   . Hypertension Maternal Grandmother   . Hyperthyroidism Maternal Grandmother    SOCIAL HISTORY: Social History   Socioeconomic History  . Marital status: Single    Spouse name: Not on file  .  Number of children: Not on file  . Years of education: Not on file  . Highest education level: Not on file  Occupational History  . Not on file  Social Needs  . Financial resource strain: Not on file  .  Food insecurity    Worry: Not on file    Inability: Not on file  . Transportation needs    Medical: Not on file    Non-medical: Not on file  Tobacco Use  . Smoking status: Never Smoker  . Smokeless tobacco: Never Used  Substance and Sexual Activity  . Alcohol use: No  . Drug use: No  . Sexual activity: Yes    Partners: Male    Birth control/protection: I.U.D.  Lifestyle  . Physical activity    Days per week: Not on file    Minutes per session: Not on file  . Stress: Not on file  Relationships  . Social Herbalist on phone: Not on file    Gets together: Not on file    Attends religious service: Not on file    Active member of club or organization: Not on file    Attends meetings of clubs or organizations: Not on file    Relationship status: Not on file  . Intimate partner violence    Fear of current or ex partner: Not on file    Emotionally abused: Not on file    Physically abused: Not on file    Forced sexual activity: Not on file  Other Topics Concern  . Not on file  Social History Narrative   Right handed   Lives in two story home with parents, boyfriend, child and brothers   Unemployed    REVIEW OF SYSTEMS: Constitutional: No fevers, chills, or sweats, no generalized fatigue, change in appetite Eyes: No visual changes, double vision, eye pain Ear, nose and throat: No hearing loss, ear pain, nasal congestion, sore throat Cardiovascular: No chest pain, palpitations Respiratory:  No shortness of breath at rest or with exertion, wheezes GastrointestinaI: No nausea, vomiting, diarrhea, abdominal pain, fecal incontinence Genitourinary:  No dysuria, urinary retention or frequency Musculoskeletal:  No neck pain, back pain Integumentary: No rash, pruritus, skin lesions Neurological: as above Psychiatric: No depression, insomnia, anxiety Endocrine: No palpitations, fatigue, diaphoresis, mood swings, change in appetite, change in weight, increased thirst  Hematologic/Lymphatic:  No purpura, petechiae. Allergic/Immunologic: no itchy/runny eyes, nasal congestion, recent allergic reactions, rashes  PHYSICAL EXAM: Blood pressure 90/60, pulse (!) 51, temperature 98 F (36.7 C), height 5\' 1"  (1.549 m), weight 135 lb (61.2 kg), SpO2 98 %. General: No acute distress.  Patient appears well-groomed.   Head:  Normocephalic/atraumatic Eyes:  Fundi examined but not visualized Neck: supple, no paraspinal tenderness, full range of motion Heart:  Regular rate and rhythm Lungs:  Clear to auscultation bilaterally Back: No paraspinal tenderness Neurological Exam: alert and oriented to person, place, and time. Attention span and concentration intact, recent and remote memory intact, fund of knowledge intact.  Speech fluent and not dysarthric, language intact.  CN II-XII intact. Bulk and tone normal, muscle strength 5/5 throughout.  Sensation to light touch  intact.  Deep tendon reflexes 2+ throughout, toes downgoing.  Finger to nose testing intact.  Gait normal, Romberg negative.  IMPRESSION: Migraine without aura, without status migrainosus, not intractable  PLAN: 1.  For preventative management, topiramate 75mg  at bedtime 2.  For abortive therapy, Maxalt, icy patch 3.  Limit use of pain relievers to no more than  2 days out of week to prevent risk of rebound or medication-overuse headache. 4.  Keep headache diary 5.  Exercise, hydration, caffeine cessation, sleep hygiene, monitor for and avoid triggers 6.  Consider:  magnesium citrate 400mg  daily, riboflavin 400mg  daily, and coenzyme Q10 100mg  three times daily 7. Follow up 6 months   Shon MilletAdam , DO  CC: Jamelle Rushinghelsey Anderson, DO

## 2019-01-17 ENCOUNTER — Other Ambulatory Visit: Payer: Self-pay

## 2019-01-17 ENCOUNTER — Ambulatory Visit: Payer: Medicaid Other | Admitting: Neurology

## 2019-01-17 ENCOUNTER — Encounter: Payer: Self-pay | Admitting: Neurology

## 2019-01-17 VITALS — BP 90/60 | HR 51 | Temp 98.0°F | Ht 61.0 in | Wt 135.0 lb

## 2019-01-17 DIAGNOSIS — G43009 Migraine without aura, not intractable, without status migrainosus: Secondary | ICD-10-CM

## 2019-01-17 NOTE — Patient Instructions (Addendum)
1.  Take topiramate 75mg  at bedtime 2.  At earliest onset of headache, take rizatriptan 10mg .  May repeat dose after 2 hours if needed.  Maximum 2 tablets in 24 hours. 3.  Limit use of pain relievers to no more than 2 days out of week to prevent risk of rebound or medication-overuse headache. 4.  Keep headache diary 5.  Keep hydrated, manage stress, get good sleep 6.  Follow up in 6 months (virtual visit)

## 2019-01-24 DIAGNOSIS — R55 Syncope and collapse: Secondary | ICD-10-CM | POA: Diagnosis not present

## 2019-01-24 DIAGNOSIS — G43909 Migraine, unspecified, not intractable, without status migrainosus: Secondary | ICD-10-CM | POA: Diagnosis not present

## 2019-01-24 DIAGNOSIS — R5383 Other fatigue: Secondary | ICD-10-CM | POA: Diagnosis not present

## 2019-01-28 ENCOUNTER — Other Ambulatory Visit: Payer: Self-pay

## 2019-01-28 ENCOUNTER — Other Ambulatory Visit (HOSPITAL_COMMUNITY)
Admission: RE | Admit: 2019-01-28 | Discharge: 2019-01-28 | Disposition: A | Discharge status: Home / Self Care | Payer: Medicaid Other | Source: Ambulatory Visit | Attending: Obstetrics | Admitting: Obstetrics

## 2019-01-28 ENCOUNTER — Encounter: Payer: Self-pay | Admitting: Obstetrics

## 2019-01-28 ENCOUNTER — Ambulatory Visit (INDEPENDENT_AMBULATORY_CARE_PROVIDER_SITE_OTHER): Payer: Medicaid Other | Admitting: Obstetrics

## 2019-01-28 VITALS — BP 121/79 | HR 67 | Wt 132.0 lb

## 2019-01-28 DIAGNOSIS — G43919 Migraine, unspecified, intractable, without status migrainosus: Secondary | ICD-10-CM | POA: Diagnosis not present

## 2019-01-28 DIAGNOSIS — B3731 Acute candidiasis of vulva and vagina: Secondary | ICD-10-CM

## 2019-01-28 DIAGNOSIS — Z975 Presence of (intrauterine) contraceptive device: Secondary | ICD-10-CM | POA: Diagnosis not present

## 2019-01-28 DIAGNOSIS — F410 Panic disorder [episodic paroxysmal anxiety] without agoraphobia: Secondary | ICD-10-CM | POA: Diagnosis not present

## 2019-01-28 DIAGNOSIS — N898 Other specified noninflammatory disorders of vagina: Secondary | ICD-10-CM | POA: Insufficient documentation

## 2019-01-28 DIAGNOSIS — B373 Candidiasis of vulva and vagina: Secondary | ICD-10-CM | POA: Diagnosis not present

## 2019-01-28 MED ORDER — FLUCONAZOLE 150 MG PO TABS: 150.0000 mg | ORAL_TABLET | Freq: Once | ORAL | 0 refills | Status: AC

## 2019-01-28 NOTE — Progress Notes (Signed)
Patient ID: Rachel Mcclure, female   DOB: 03/28/1996, 23 y.o.   MRN: [id number]  Chief Complaint  Patient presents with  . Vaginitis    HPI Rachel Mcclure is a 23 y.o. female.  Complains of vaginal discharge with itching.  Denies vaginal odor.  She is concerned as to whether her IUD is in place, and would like her IUD checked.  Recently started on Zoloft for depression and Topamax for migraines.  She complains that the Zoloft is making her gain weight and she has no sex drive.  She took the Topamax once and almost fainted.  She plans to call her PCP for medication management. HPI  Past Medical History:  Diagnosis Date  . GERD (gastroesophageal reflux disease)   . Migraines   . Panic attacks     Past Surgical History:  Procedure Laterality Date  . CESAREAN SECTION  11/23/2011   Procedure: CESAREAN SECTION;  Surgeon: Lahoma Crocker, MD;  Location: [facility];  Service: Gynecology;  Laterality: N/A;  Primary Cesarean Section Delivery Boy @ 1601,  Apgars 6/8    Family History  Problem Relation Age of Onset  . Hyperthyroidism Mother   . Asthma Brother   . Hypertension Maternal Grandmother   . Hyperthyroidism Maternal Grandmother     Social History Social History   Tobacco Use  . Smoking status: Never Smoker  . Smokeless tobacco: Never Used  Substance Use Topics  . Alcohol use: No  . Drug use: No    Allergies  Allergen Reactions  . Topamax [Topiramate]     Current Outpatient Medications  Medication Sig Dispense Refill  . ibuprofen (ADVIL,MOTRIN) 200 MG tablet Take 400 mg by mouth every 6 (six) hours as needed for headache or mild pain.    Marland Kitchen levonorgestrel (MIRENA) 20 MCG/24HR IUD 1 each by Intrauterine route once. 04/24/2012    . rizatriptan (MAXALT) 10 MG tablet Take 1 tablet earliest onset of migraine.  May repeat in 2 hours if needed.  Maximum 2 tablets in 24 hours 10 tablet 3  . sertraline (ZOLOFT) 25 MG tablet Take 25 mg by mouth daily.    . fluconazole  (DIFLUCAN) 150 MG tablet Take 1 tablet (150 mg total) by mouth once for 1 dose. 1 tablet 0  . topiramate (TOPAMAX) 25 MG tablet Take 3 tablets (75 mg total) by mouth at bedtime. (Patient not taking: Reported on 01/28/2019) 90 tablet 3   No current facility-administered medications for this visit.     Review of Systems Review of Systems Constitutional: negative for fatigue and weight loss Respiratory: negative for cough and wheezing Cardiovascular: negative for chest pain, fatigue and palpitations Gastrointestinal: negative for abdominal pain and change in bowel habits Genitourinary: positive for vaginal discharge with itching Integument/breast: negative for nipple discharge Musculoskeletal:negative for myalgias Neurological: negative for gait problems and tremors Behavioral/Psych: negative for abusive relationship, depression Endocrine: negative for temperature intolerance      Blood pressure 121/79, pulse 67, weight 132 lb (59.9 kg).  Physical Exam Physical Exam           General:  Alert and no distress Abdomen:  normal findings: no organomegaly, soft, non-tender and no hernia  Pelvis:  External genitalia: normal general appearance Urinary system: urethral meatus normal and bladder without full Vaginal: thick curdy discharge Cervix: normal appearance Adnexa: normal bimanual exam Uterus: anteverted and non-tender, normal size    50% of 15 min visit spent on counseling and coordination of care.   Data Reviewed Labs  Assessment       1. Vaginal discharge Rx: - Cervicovaginal ancillary only( [place]) - Hepatitis B surface antigen - Hepatitis C antibody - HIV Antibody (routine testing w rflx) - RPR  2. Candida vaginitisx Rx: - fluconazole (DIFLUCAN) 150 MG tablet; Take 1 tablet (150 mg total) by mouth once for 1 dose.  Dispense: 1 tablet; Refill: 0  3. IUD (intrauterine device) in place - doing well  4. Intractable migraine without status migrainosus, unspecified  migraine type - managed by PCP  5. Panic attacks - managed by PCP   Plan    Follow up in 2 months for Annual  Orders Placed This Encounter  Procedures  . Hepatitis B surface antigen  . Hepatitis C antibody  . HIV Antibody (routine testing w rflx)  . RPR   Meds ordered this encounter  Medications  . fluconazole (DIFLUCAN) 150 MG tablet    Sig: Take 1 tablet (150 mg total) by mouth once for 1 dose.    Dispense:  1 tablet    Refill:  0    Brock Bad, MD 01/28/2019 10:56 AM

## 2019-01-28 NOTE — Progress Notes (Signed)
IUD Mirena inserted 04/19/2017. Pt reports loss of sex drive, but also reports that she has been "stressed out". Pt had a recent adverse drug reaction to taking topamax for migraine. Pt is taking Zoloft for anxiety, has been taking it for a couple of months. Pt reports that she has been having discharge and vaginal itching after her menstrual cycle.

## 2019-01-29 LAB — RPR: RPR Ser Ql: NONREACTIVE

## 2019-01-29 LAB — HEPATITIS C ANTIBODY: Hep C Virus Ab: <0.1 s/co ratio (ref 0.0–0.9)

## 2019-01-29 LAB — HEPATITIS B SURFACE ANTIGEN: Hepatitis B Surface Ag: NEGATIVE

## 2019-01-29 LAB — HIV ANTIBODY (ROUTINE TESTING W REFLEX): HIV Screen 4th Generation wRfx: NONREACTIVE

## 2019-02-04 LAB — CERVICOVAGINAL ANCILLARY ONLY
Bacterial Vaginitis (gardnerella): POSITIVE — AB
Candida Glabrata: NEGATIVE
Candida Vaginitis: POSITIVE — AB
Chlamydia: NEGATIVE
Comment: NEGATIVE
Comment: NEGATIVE
Comment: NEGATIVE
Comment: NEGATIVE
Comment: NEGATIVE
Comment: NORMAL
Neisseria Gonorrhea: NEGATIVE
Trichomonas: NEGATIVE

## 2019-02-05 ENCOUNTER — Telehealth: Payer: Self-pay

## 2019-02-05 ENCOUNTER — Other Ambulatory Visit: Payer: Self-pay | Admitting: Obstetrics

## 2019-02-05 DIAGNOSIS — B9689 Other specified bacterial agents as the cause of diseases classified elsewhere: Secondary | ICD-10-CM

## 2019-02-05 DIAGNOSIS — N760 Acute vaginitis: Secondary | ICD-10-CM

## 2019-02-05 DIAGNOSIS — B373 Candidiasis of vulva and vagina: Secondary | ICD-10-CM

## 2019-02-05 DIAGNOSIS — B3731 Acute candidiasis of vulva and vagina: Secondary | ICD-10-CM

## 2019-02-05 MED ORDER — TINIDAZOLE 500 MG PO TABS: 1000.0000 mg | ORAL_TABLET | Freq: Every day | ORAL | 2 refills | Status: DC

## 2019-02-05 MED ORDER — FLUCONAZOLE 150 MG PO TABS: 150.0000 mg | ORAL_TABLET | Freq: Once | ORAL | 0 refills | Status: AC

## 2019-02-05 NOTE — Telephone Encounter (Addendum)
  Unable to reach pt/ leave mess, her VM Box is not set up.  MyChart link sent.

## 2019-02-05 NOTE — Telephone Encounter (Signed)
-----   Message from Shelly Bombard, MD sent at 02/05/2019 10:29 AM EST ----- Tindamax Rx for BV Diflucan Rx for yeast

## 2019-02-05 NOTE — Telephone Encounter (Signed)
Patient returned call, notified of results and RX.

## 2019-02-07 DIAGNOSIS — G43909 Migraine, unspecified, not intractable, without status migrainosus: Secondary | ICD-10-CM | POA: Diagnosis not present

## 2019-02-07 DIAGNOSIS — F411 Generalized anxiety disorder: Secondary | ICD-10-CM | POA: Diagnosis not present

## 2019-03-24 ENCOUNTER — Other Ambulatory Visit: Payer: Self-pay

## 2019-03-24 ENCOUNTER — Ambulatory Visit: Payer: Medicaid Other | Admitting: Obstetrics

## 2019-03-24 DIAGNOSIS — Z01419 Encounter for gynecological examination (general) (routine) without abnormal findings: Secondary | ICD-10-CM

## 2019-03-24 NOTE — Progress Notes (Signed)
Patient rescheduled

## 2019-04-10 ENCOUNTER — Other Ambulatory Visit (HOSPITAL_COMMUNITY)
Admission: RE | Admit: 2019-04-10 | Discharge: 2019-04-10 | Disposition: A | Discharge status: Home / Self Care | Payer: Medicaid Other | Source: Ambulatory Visit | Attending: Obstetrics | Admitting: Obstetrics

## 2019-04-10 ENCOUNTER — Ambulatory Visit (INDEPENDENT_AMBULATORY_CARE_PROVIDER_SITE_OTHER): Payer: Medicaid Other | Admitting: Obstetrics

## 2019-04-10 ENCOUNTER — Encounter: Payer: Self-pay | Admitting: Obstetrics

## 2019-04-10 ENCOUNTER — Other Ambulatory Visit: Payer: Self-pay

## 2019-04-10 VITALS — BP 120/77 | HR 80 | Wt 132.0 lb

## 2019-04-10 DIAGNOSIS — B373 Candidiasis of vulva and vagina: Secondary | ICD-10-CM

## 2019-04-10 DIAGNOSIS — B3731 Acute candidiasis of vulva and vagina: Secondary | ICD-10-CM

## 2019-04-10 DIAGNOSIS — N898 Other specified noninflammatory disorders of vagina: Secondary | ICD-10-CM

## 2019-04-10 DIAGNOSIS — Z01419 Encounter for gynecological examination (general) (routine) without abnormal findings: Secondary | ICD-10-CM | POA: Diagnosis not present

## 2019-04-10 DIAGNOSIS — Z975 Presence of (intrauterine) contraceptive device: Secondary | ICD-10-CM

## 2019-04-10 DIAGNOSIS — Z0000 Encounter for general adult medical examination without abnormal findings: Secondary | ICD-10-CM | POA: Diagnosis not present

## 2019-04-10 DIAGNOSIS — F410 Panic disorder [episodic paroxysmal anxiety] without agoraphobia: Secondary | ICD-10-CM

## 2019-04-10 MED ORDER — FLUCONAZOLE 150 MG PO TABS: 150.0000 mg | ORAL_TABLET | Freq: Once | ORAL | 0 refills | Status: AC

## 2019-04-10 MED ORDER — PAROXETINE HCL 20 MG PO TABS: 20.0000 mg | ORAL_TABLET | Freq: Every day | ORAL | 11 refills | Status: DC

## 2019-04-10 NOTE — Progress Notes (Signed)
Pt has IUD in place. Pt states she is having increase in cramping and back pain.

## 2019-04-10 NOTE — Progress Notes (Signed)
Subjective:        Rachel Mcclure is a 24 y.o. female here for a routine exam.  Current complaints: Very stressed from family discord.  She has a history of anxiety / panic attacks.  Personal health questionnaire:  Is patient Ashkenazi Jewish, have a family history of breast and/or ovarian cancer: no Is there a family history of uterine cancer diagnosed at age < 66, gastrointestinal cancer, urinary tract cancer, family member who is a Field seismologist syndrome-associated carrier: no Is the patient overweight and hypertensive, family history of diabetes, personal history of gestational diabetes, preeclampsia or PCOS: no Is patient over 80, have PCOS,  family history of premature CHD under age 75, diabetes, smoke, have hypertension or peripheral artery disease:  no At any time, has a partner hit, kicked or otherwise hurt or frightened you?: no Over the past 2 weeks, have you felt down, depressed or hopeless?: no Over the past 2 weeks, have you felt little interest or pleasure in doing things?:no   Gynecologic History Patient's last menstrual period was 03/18/2019. Contraception: IUD Last Pap: 03-18-2018. Results were: normal Last mammogram: n/a. Results were: n/a  Obstetric History OB History  Gravida Para Term Preterm AB Living  1 1 1  0 0 1  SAB TAB Ectopic Multiple Live Births  0 0 0 0 1    # Outcome Date GA Lbr Len/2nd Weight Sex Delivery Anes PTL Lv  1 Term 11/23/11 [redacted]w[redacted]d 19:15 / 04:25  M CS-LTranv EPI      Past Medical History:  Diagnosis Date  . GERD (gastroesophageal reflux disease)   . Migraines   . Panic attacks     Past Surgical History:  Procedure Laterality Date  . CESAREAN SECTION  11/23/2011   Procedure: CESAREAN SECTION;  Surgeon: Lahoma Crocker, MD;  Location: [facility];  Service: Gynecology;  Laterality: N/A;  Primary Cesarean Section Delivery Boy @ 0240,  Apgars 6/8     Current Outpatient Medications:  .  fluconazole (DIFLUCAN) 150 MG tablet, Take 1 tablet  (150 mg total) by mouth once for 1 dose., Disp: 1 tablet, Rfl: 0 .  ibuprofen (ADVIL,MOTRIN) 200 MG tablet, Take 400 mg by mouth every 6 (six) hours as needed for headache or mild pain., Disp: , Rfl:  .  levonorgestrel (MIRENA) 20 MCG/24HR IUD, 1 each by Intrauterine route once. 04/24/2012, Disp: , Rfl:  .  PARoxetine (PAXIL) 20 MG tablet, Take 1 tablet (20 mg total) by mouth daily., Disp: 30 tablet, Rfl: 11 .  rizatriptan (MAXALT) 10 MG tablet, Take 1 tablet earliest onset of migraine.  May repeat in 2 hours if needed.  Maximum 2 tablets in 24 hours, Disp: 10 tablet, Rfl: 3 .  sertraline (ZOLOFT) 25 MG tablet, Take 25 mg by mouth daily., Disp: , Rfl:  .  tinidazole (TINDAMAX) 500 MG tablet, Take 2 tablets (1,000 mg total) by mouth daily with breakfast., Disp: 10 tablet, Rfl: 2 .  topiramate (TOPAMAX) 25 MG tablet, Take 3 tablets (75 mg total) by mouth at bedtime. (Patient not taking: Reported on 01/28/2019), Disp: 90 tablet, Rfl: 3 Allergies  Allergen Reactions  . Topamax [Topiramate]     Social History   Tobacco Use  . Smoking status: Never Smoker  . Smokeless tobacco: Never Used  Substance Use Topics  . Alcohol use: No    Family History  Problem Relation Age of Onset  . Hyperthyroidism Mother   . Asthma Brother   . Hypertension Maternal Grandmother   . Hyperthyroidism  Maternal Grandmother       Review of Systems  Constitutional: negative for fatigue and weight loss Respiratory: negative for cough and wheezing Cardiovascular: negative for chest pain, fatigue and palpitations Gastrointestinal: negative for abdominal pain and change in bowel habits Musculoskeletal:negative for myalgias Neurological: negative for gait problems and tremors Behavioral/Psych: positive for anxiety. negative for abusive relationship Endocrine: negative for temperature intolerance    Genitourinary:negative for abnormal menstrual periods, genital lesions, hot flashes, sexual problems and vaginal  discharge Integument/breast: negative for breast lump, breast tenderness, nipple discharge and skin lesion(s)    Objective:       BP 120/77   Pulse 80   Wt 132 lb (59.9 kg)   LMP 03/18/2019   BMI 24.94 kg/m  General:   alert  Skin:   no rash or abnormalities  Lungs:   clear to auscultation bilaterally  Heart:   regular rate and rhythm, S1, S2 normal, no murmur, click, rub or gallop  Breasts:   normal without suspicious masses, skin or nipple changes or axillary nodes  Abdomen:  normal findings: no organomegaly, soft, non-tender and no hernia  Pelvis:  External genitalia: normal general appearance Urinary system: urethral meatus normal and bladder without fullness, nontender Vaginal: normal without tenderness, induration or masses Cervix: normal appearance. IUD string visible. Adnexa: normal bimanual exam Uterus: anteverted and non-tender, normal size   Lab Review Urine pregnancy test Labs reviewed yes Radiologic studies reviewed no  50% of 25 min visit spent on counseling and coordination of care.   Assessment:     1. Encounter for gynecological examination with Papanicolaou smear of cervix Rx: - Cytology - PAP( [place])  2. Candida vaginitis Rx: - fluconazole (DIFLUCAN) 150 MG tablet; Take 1 tablet (150 mg total) by mouth once for 1 dose.  Dispense: 1 tablet; Refill: 0  3. Vaginal discharge Rx: - Cervicovaginal ancillary only( [place])  4. IUD (intrauterine device) in place  5. Panic attacks Rx: - PARoxetine (PAXIL) 20 MG tablet; Take 1 tablet (20 mg total) by mouth daily.  Dispense: 30 tablet; Refill: 11   Plan:    Education reviewed: calcium supplements, depression evaluation, low fat, low cholesterol diet, safe sex/STD prevention, self breast exams and weight bearing exercise. Contraception: IUD. Follow up in: 1 year.   Meds ordered this encounter  Medications  . fluconazole (DIFLUCAN) 150 MG tablet    Sig: Take 1 tablet (150 mg total) by  mouth once for 1 dose.    Dispense:  1 tablet    Refill:  0  . PARoxetine (PAXIL) 20 MG tablet    Sig: Take 1 tablet (20 mg total) by mouth daily.    Dispense:  30 tablet    Refill:  11   No orders of the defined types were placed in this encounter.   Brock Bad, MD 04/10/2019 11:13 AM

## 2019-04-14 LAB — CERVICOVAGINAL ANCILLARY ONLY
Bacterial Vaginitis (gardnerella): POSITIVE — AB
Candida Glabrata: NEGATIVE
Candida Vaginitis: POSITIVE — AB
Chlamydia: NEGATIVE
Comment: NEGATIVE
Comment: NEGATIVE
Comment: NEGATIVE
Comment: NEGATIVE
Comment: NEGATIVE
Comment: NORMAL
Neisseria Gonorrhea: NEGATIVE
Trichomonas: NEGATIVE

## 2019-04-14 LAB — CYTOLOGY - PAP
Diagnosis: NEGATIVE
Diagnosis: REACTIVE

## 2019-04-15 ENCOUNTER — Other Ambulatory Visit: Payer: Self-pay | Admitting: Obstetrics

## 2019-04-15 DIAGNOSIS — B373 Candidiasis of vulva and vagina: Secondary | ICD-10-CM

## 2019-04-15 DIAGNOSIS — B3731 Acute candidiasis of vulva and vagina: Secondary | ICD-10-CM

## 2019-04-15 MED ORDER — FLUCONAZOLE 150 MG PO TABS: 150.0000 mg | ORAL_TABLET | Freq: Once | ORAL | 0 refills | Status: AC

## 2019-04-15 NOTE — Progress Notes (Signed)
Tindamax Rx for BV Diflucan Rx for yeast

## 2019-04-23 DIAGNOSIS — H0100A Unspecified blepharitis right eye, upper and lower eyelids: Secondary | ICD-10-CM | POA: Diagnosis not present

## 2019-04-30 DIAGNOSIS — R0789 Other chest pain: Secondary | ICD-10-CM | POA: Diagnosis not present

## 2019-04-30 DIAGNOSIS — R011 Cardiac murmur, unspecified: Secondary | ICD-10-CM | POA: Diagnosis not present

## 2019-04-30 DIAGNOSIS — K219 Gastro-esophageal reflux disease without esophagitis: Secondary | ICD-10-CM | POA: Diagnosis not present

## 2019-05-10 IMAGING — CT CT HEAD W/O CM
1 series · 16 of 29 positions shown, 20 images · non-contrast
Comparison: None.

CLINICAL DATA: Intractable headaches, increasing in frequency

EXAM:
CT HEAD WITHOUT CONTRAST
TECHNIQUE: Contiguous axial images were obtained from the base of the skull
through the vertex without intravenous contrast.

[Series 2: head w/(date) · axial · 0.40mm/px · z∈[-130,+0]mm · 16 of 29 slices shown, 20 images]
[im 2/29  brain]
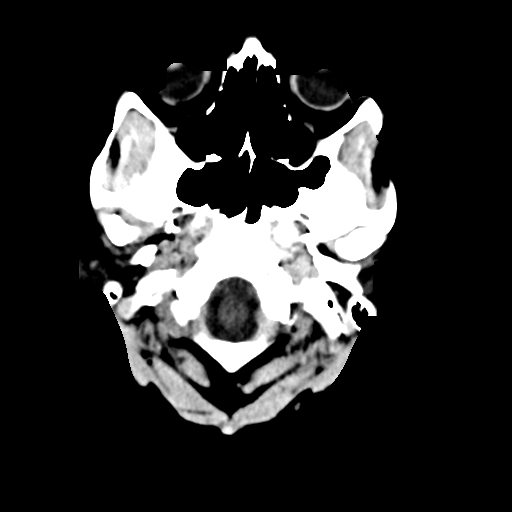
[im 2/29  bone]
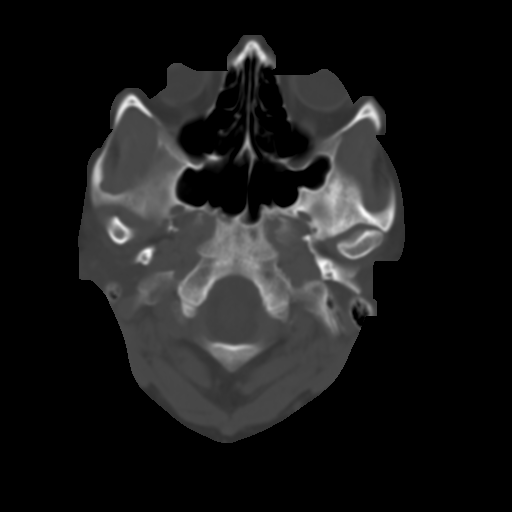
[im 4/29  brain]
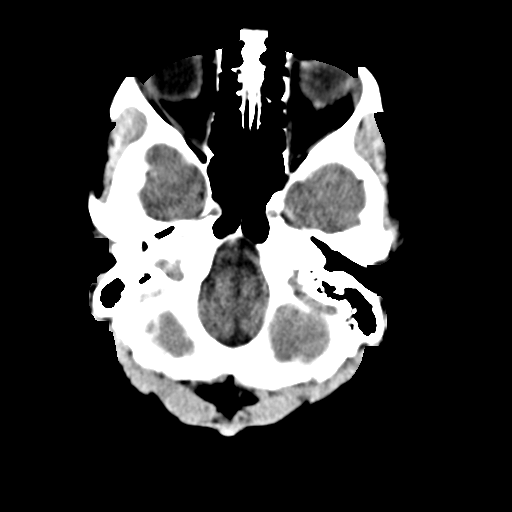
[im 6/29  brain]
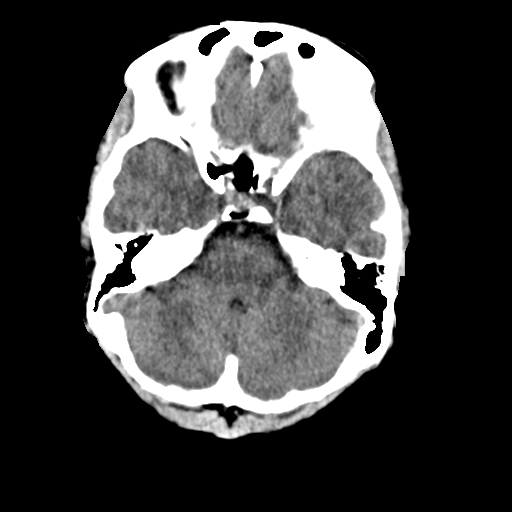
[im 7/29  brain]
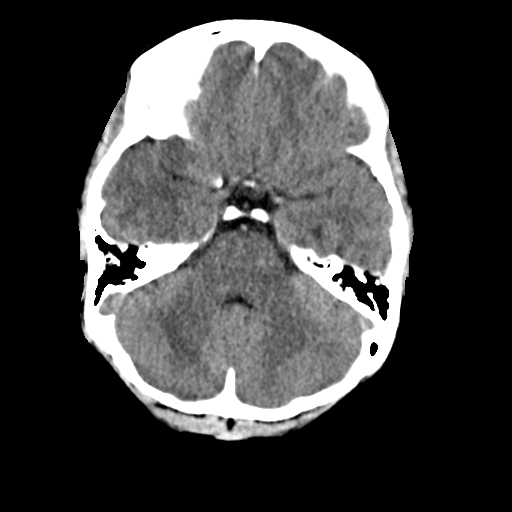
[im 9/29  brain]
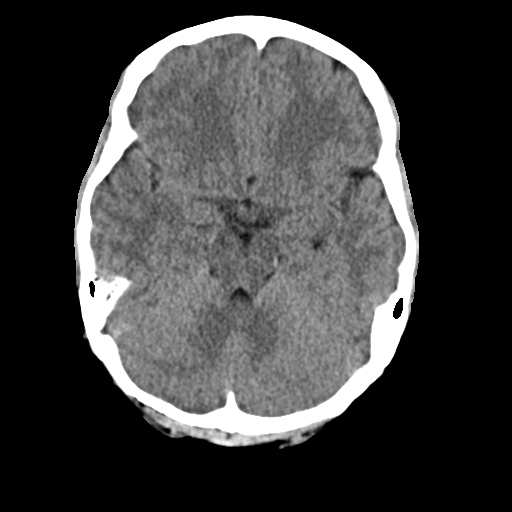
[im 9/29  bone]
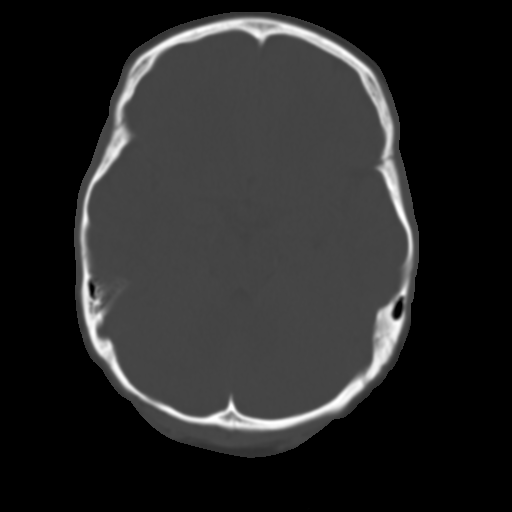
[im 11/29  brain]
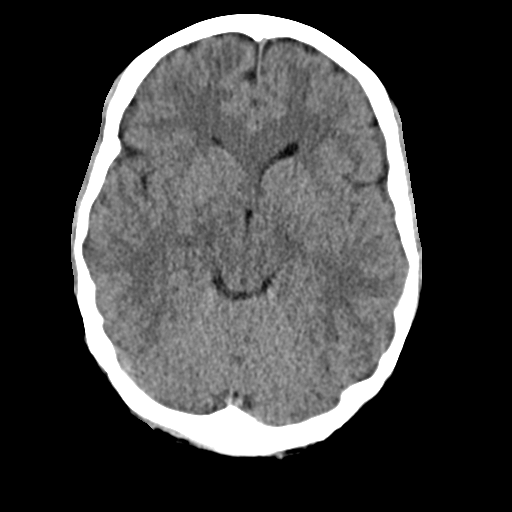
[im 12/29  brain]
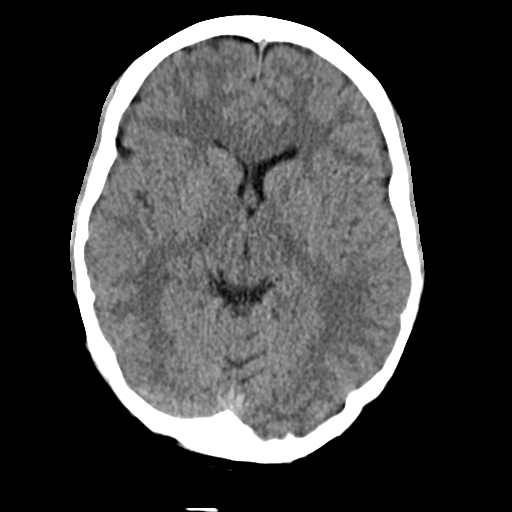
[im 14/29  brain]
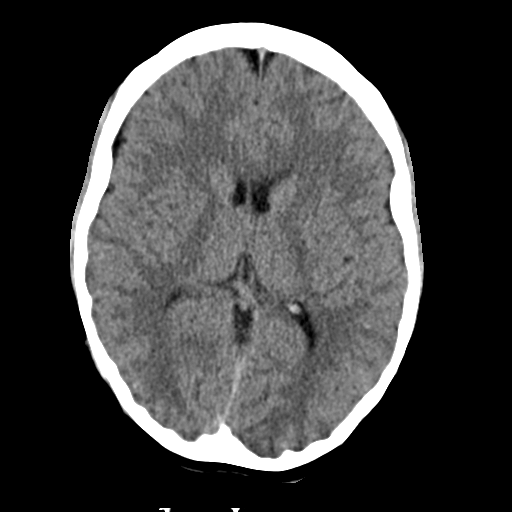
[im 16/29  brain]
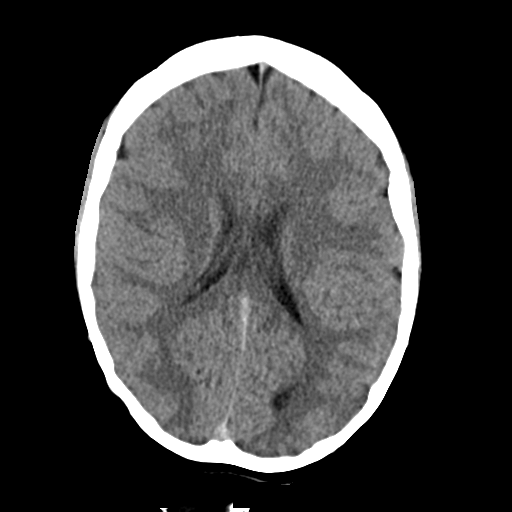
[im 16/29  bone]
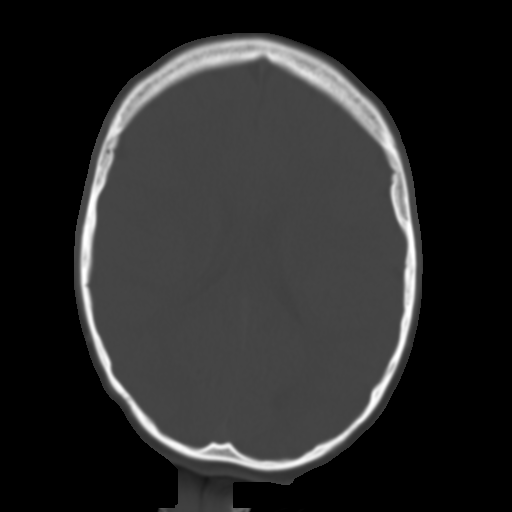
[im 18/29  brain]
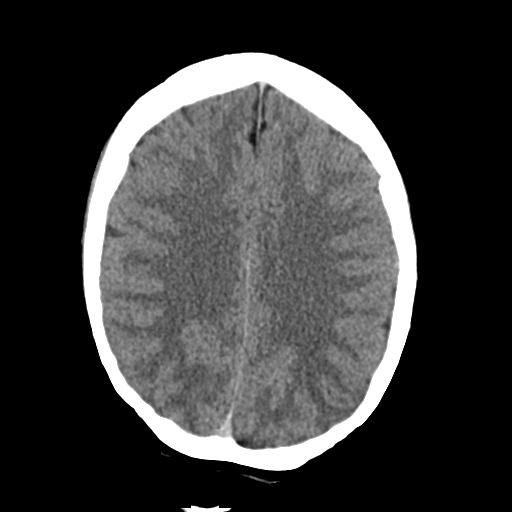
[im 19/29  brain]
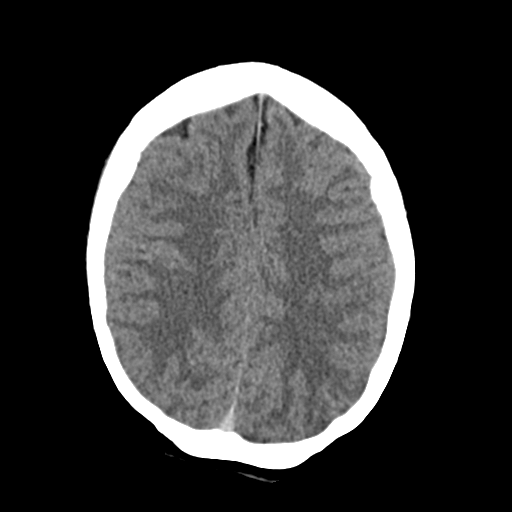
[im 21/29  brain]
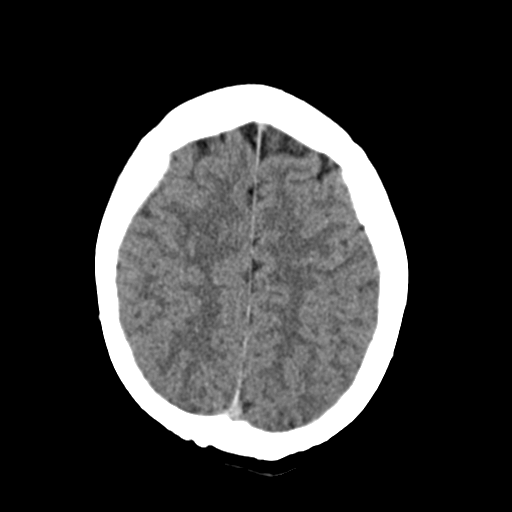
[im 23/29  brain]
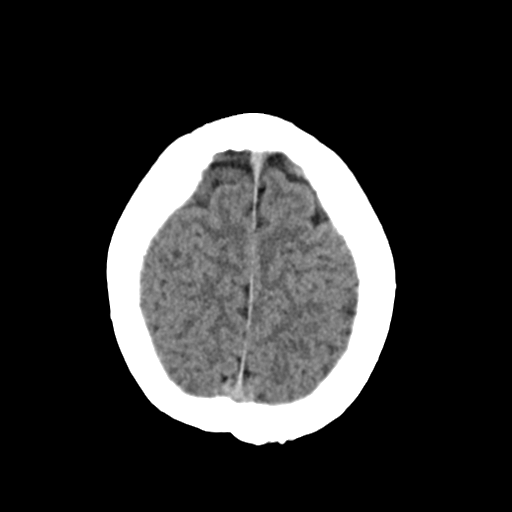
[im 23/29  bone]
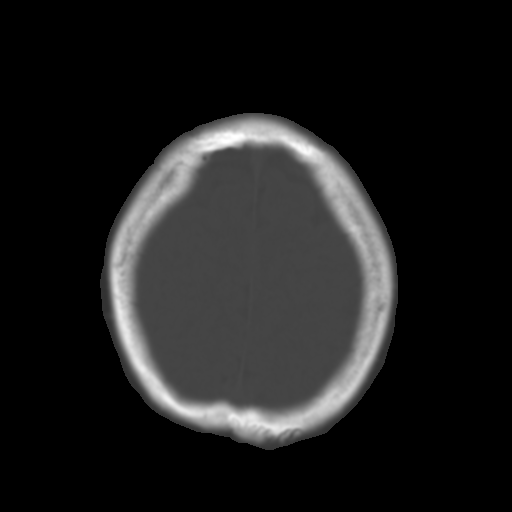
[im 24/29  brain]
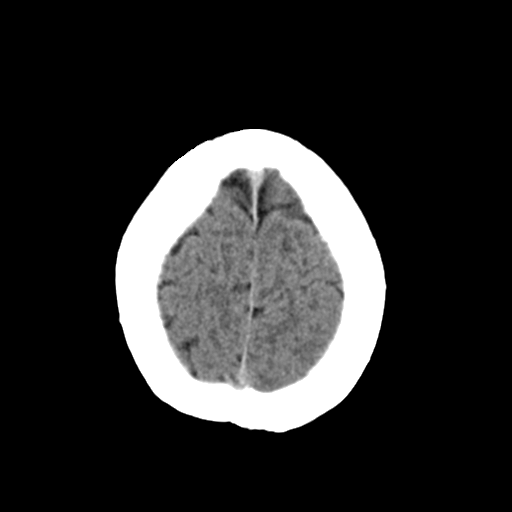
[im 26/29  brain]
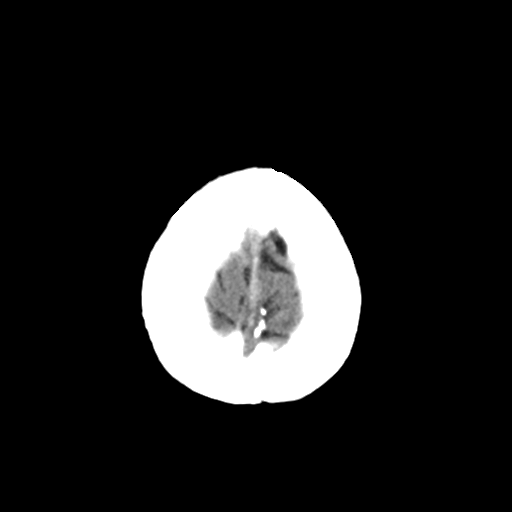
[im 28/29  brain]
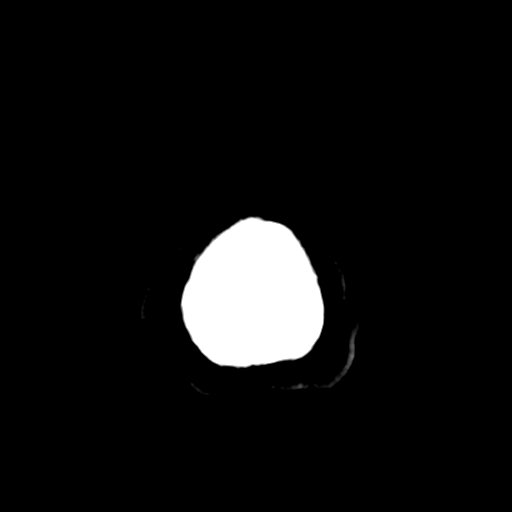

[16 of 29 positions shown; findings below may reference images not displayed]

FINDINGS: Brain: Ventricles are normal in size and configuration. There is no
intracranial mass, hemorrhage, extra-axial fluid collection, or
midline shift. Brain parenchyma appears unremarkable. No acute
infarct evident.

Vascular: No hyperdense vessel.  No evident vascular calcification.

Skull: The bony calvarium appears intact.

Sinuses/Orbits: Visualized paranasal sinuses are clear. Visualized
orbits appear symmetric bilaterally.

Other: Mastoid air cells are clear.
IMPRESSION: Study within normal limits.

## 2019-06-02 ENCOUNTER — Telehealth: Payer: Self-pay

## 2019-06-02 NOTE — Telephone Encounter (Signed)
Return call to pt regarding message left on vm  Pt not ava no vm

## 2019-06-04 DIAGNOSIS — M545 Low back pain: Secondary | ICD-10-CM | POA: Diagnosis not present

## 2019-06-04 DIAGNOSIS — M25561 Pain in right knee: Secondary | ICD-10-CM | POA: Diagnosis not present

## 2019-06-04 DIAGNOSIS — M222X1 Patellofemoral disorders, right knee: Secondary | ICD-10-CM | POA: Diagnosis not present

## 2019-06-09 ENCOUNTER — Ambulatory Visit (INDEPENDENT_AMBULATORY_CARE_PROVIDER_SITE_OTHER): Payer: Medicaid Other | Admitting: Orthopaedic Surgery

## 2019-06-09 ENCOUNTER — Ambulatory Visit (INDEPENDENT_AMBULATORY_CARE_PROVIDER_SITE_OTHER): Payer: Medicaid Other

## 2019-06-09 ENCOUNTER — Other Ambulatory Visit: Payer: Self-pay

## 2019-06-09 ENCOUNTER — Encounter: Payer: Self-pay | Admitting: Orthopaedic Surgery

## 2019-06-09 VITALS — Ht 61.0 in | Wt 140.0 lb

## 2019-06-09 DIAGNOSIS — M25561 Pain in right knee: Secondary | ICD-10-CM | POA: Diagnosis not present

## 2019-06-09 MED ORDER — METHYLPREDNISOLONE ACETATE 40 MG/ML IJ SUSP
40.0000 mg | INTRAMUSCULAR | Status: AC | PRN
  Administered 2019-06-09: 40 mg via INTRA_ARTICULAR

## 2019-06-09 MED ORDER — BUPIVACAINE HCL 0.25 % IJ SOLN
2.0000 mL | INTRAMUSCULAR | Status: AC | PRN
  Administered 2019-06-09: 2 mL via INTRA_ARTICULAR

## 2019-06-09 MED ORDER — LIDOCAINE HCL 1 % IJ SOLN
2.0000 mL | INTRAMUSCULAR | Status: AC | PRN
  Administered 2019-06-09: 2 mL

## 2019-06-09 NOTE — Progress Notes (Signed)
Office Visit Note   Patient: Rachel Mcclure           Date of Birth: Nov 19, 1995           MRN: [id number] Visit Date: 06/09/2019              Requested by: Anderson, Chelsey L, DO [place],  [place] [id number] PCP: Richarda Osmond, DO   Assessment & Plan: Visit Diagnoses:  1. Acute pain of right knee     Plan: Impression is possible lateral meniscus tear right knee.  The we will proceed with right knee cortisone injection today.  She will give [place] at least 2 to 3 weeks to see if she notices relief and at that point if there is no relief we will get an MRI to assess the meniscus.  Follow-up with [place] as needed otherwise.  Follow-Up Instructions: Return if symptoms worsen or fail to improve.   Orders:  Orders Placed This Encounter  Procedures  . Large Joint Inj: L knee  . XR KNEE 3 VIEW RIGHT   No orders of the defined types were placed in this encounter.     Procedures: Large Joint Inj: L knee on 06/09/2019 3:31 PM Indications: pain Details: 22 G needle, anterolateral approach Medications: 2 mL lidocaine 1 %; 2 mL bupivacaine 0.25 %; 40 mg methylPREDNISolone acetate 40 MG/ML      Clinical Data: No additional findings.   Subjective: Chief Complaint  Patient presents with  . Right Knee - Pain    HPI patient is a pleasant 24 year old girl who comes in today with right lateral knee pain for the past 4 days.  No known injury or change in activity.  The pain she has is along the lateral joint line.  She has associated locking and popping.  Pain is aggravated with squatting as well as when she is lying in bed with her knees in a flexed position.  She has not taken any medication for this.  No previous injury to either knee.  Review of Systems as detailed in HPI.  All others reviewed and are negative.   Objective: Vital Signs: Ht 5\' 1"  (1.549 m)   Wt 140 lb (63.5 kg)   LMP 04/19/2019   BMI 26.45 kg/m   Physical Exam well-developed  well-nourished female no acute distress.  Alert O x3.  Ortho Exam examination of the right knee reveals a trace effusion.  Range of motion 0 to 115 degrees.  Marked tenderness to the lateral joint line.  Negative McMurray.  Ligaments are stable.  She is neurovascularly intact distally.  Specialty Comments:  No specialty comments available.  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  Otherwise, no acute findings.    PMFS History: Patient Active Problem List   Diagnosis Date Noted  . Cough 08/10/2017  . Allergic rhinitis 07/25/2017  . Feeling tired 01/04/2017  . Panic attack as reaction to stress 02/17/2014  . GERD (gastroesophageal reflux disease) 01/23/2014   Past Medical History:  Diagnosis Date  . GERD (gastroesophageal reflux disease)   . Migraines   . Panic attacks     Family History  Problem Relation Age of Onset  . Hyperthyroidism Mother   . Asthma Brother   . Hypertension Maternal Grandmother   . Hyperthyroidism Maternal Grandmother     Past Surgical History:  Procedure Laterality Date  . CESAREAN SECTION  11/23/2011  Procedure: CESAREAN SECTION;  Surgeon: Antionette Char, MD;  Location: [facility];  Service: Gynecology;  Laterality: N/A;  Primary Cesarean Section Delivery Boy @ 0240,  Apgars 6/8   Social History   Occupational History  . Not on file  Tobacco Use  . Smoking status: Never Smoker  . Smokeless tobacco: Never Used  Substance and Sexual Activity  . Alcohol use: No  . Drug use: No  . Sexual activity: Yes    Partners: Male    Birth control/protection: I.U.D.

## 2019-07-16 NOTE — Progress Notes (Deleted)
NEUROLOGY FOLLOW UP OFFICE NOTE  Rachel Mcclure [id number]  HISTORY OF PRESENT ILLNESS: Rachel Mcclure is a 24 year old woman who follows up for migraine.  UPDATE: Overall doing well.  Trying to manage stress. Intensity:  severe Duration:  Unsure.  She goes to sleep and gone when she wakes up Frequency:  1 to 2 times a month. Frequency of abortive medication: 1 to 2 times a month. Rescue protocol:  Maxalt, Icy patch, warm bath Current NSAIDS:Ibuprofen(rarely) Current analgesics:Tylenol (rarely) Current triptans:Maxalt 10mg  Current ergotamine:none Current anti-emetic:none Current muscle relaxants:none Current anti-anxiolytic:none Current sleep aide:none Current Antihypertensive medications:none Current Antidepressant medications:Sertraline 25mg  Current Anticonvulsant medications:topiramate 75mg  at bedtime Current anti-CGRP:none Current Vitamins/Herbal/Supplements:none Current Antihistamines/Decongestants:none Other therapy:Icy patch to the forehead Hormone/birth control:Mirena Other medications:none  Caffeine:Infrequent coffee. Sometimes Pepsi Diet:Hydrates. Sometimes Sprite and Pepsi Depression and anxiety: Yes  HISTORY: Onset: 24 years old Location:Bifrontal and bridge of nose Quality:Sharp, aching Initial Intensity:10/10. Shedenies new headache, thunderclap headache Aura:no Premonitory Phase:no Postdrome:no Associated symptoms:Nausea, vomiting, photophobia, phonophobia.Sometimes dizzy. She reports occasional numbness and tingling of the fingers which she attributes to associated panic attacks. Shedenies associated unilateralweakness. Initial Duration:1 day Initial Frequency:Once a week Frequency of abortive medication:infrequent Triggers:  Emotional stress, maybe menstrual cycle. Relieving factors:Icy patch Activity:aggravates  She has been to the ED on several occasions  regarding her headaches. CT head without contrast from 03/25/18 was personally reviewed and was normal.  Past NSAIDS:none Past analgesics:none Past abortive triptans:Sumatriptan 100mg (ineffective) Past abortive ergotamine:none Past muscle relaxants:none Past anti-emetic:none Past antihypertensive medications:none Past antidepressant medications:none Past anticonvulsant medications:none Past anti-CGRP:none Past vitamins/Herbal/Supplements:none Past antihistamines/decongestants:none Other past therapies:none  PAST MEDICAL HISTORY: Past Medical History:  Diagnosis Date  . GERD (gastroesophageal reflux disease)   . Migraines   . Panic attacks     MEDICATIONS: Current Outpatient Medications on File Prior to Visit  Medication Sig Dispense Refill  . ibuprofen (ADVIL,MOTRIN) 200 MG tablet Take 400 mg by mouth every 6 (six) hours as needed for headache or mild pain.    Marland Kitchen levonorgestrel (MIRENA) 20 MCG/24HR IUD 1 each by Intrauterine route once. 04/24/2012    . PARoxetine (PAXIL) 20 MG tablet Take 1 tablet (20 mg total) by mouth daily. 30 tablet 11  . rizatriptan (MAXALT) 10 MG tablet Take 1 tablet earliest onset of migraine.  May repeat in 2 hours if needed.  Maximum 2 tablets in 24 hours 10 tablet 3  . tinidazole (TINDAMAX) 500 MG tablet Take 2 tablets (1,000 mg total) by mouth daily with breakfast. 10 tablet 2  . topiramate (TOPAMAX) 25 MG tablet Take 3 tablets (75 mg total) by mouth at bedtime. 90 tablet 3   No current facility-administered medications on file prior to visit.    ALLERGIES: Allergies  Allergen Reactions  . Topamax [Topiramate]     FAMILY HISTORY: Family History  Problem Relation Age of Onset  . Hyperthyroidism Mother   . Asthma Brother   . Hypertension Maternal Grandmother   . Hyperthyroidism Maternal Grandmother     SOCIAL HISTORY: Social History   Socioeconomic History  . Marital status: Single    Spouse name: Not on  file  . Number of children: Not on file  . Years of education: Not on file  . Highest education level: Not on file  Occupational History  . Not on file  Tobacco Use  . Smoking status: Never Smoker  . Smokeless tobacco: Never Used  Substance and Sexual Activity  . Alcohol use: No  . Drug use: No  .  Sexual activity: Yes    Partners: Male    Birth control/protection: I.U.D.  Other Topics Concern  . Not on file  Social History Narrative   Right handed   Lives in two story home with parents, boyfriend, child and brothers   Working   Programme researcher, broadcasting/film/video - high school diploma      Caffeine  - soda 12 oz / day    Exercise some    Social Determinants of Corporate investment banker Strain:   . Difficulty of Paying Living Expenses:   Food Insecurity:   . Worried About Programme researcher, broadcasting/film/video in the Last Year:   . Barista in the Last Year:   Transportation Needs:   . Freight forwarder (Medical):   Marland Kitchen Lack of Transportation (Non-Medical):   Physical Activity:   . Days of Exercise per Week:   . Minutes of Exercise per Session:   Stress:   . Feeling of Stress :   Social Connections:   . Frequency of Communication with Friends and Family:   . Frequency of Social Gatherings with Friends and Family:   . Attends Religious Services:   . Active Member of Clubs or Organizations:   . Attends Banker Meetings:   Marland Kitchen Marital Status:   Intimate Partner Violence:   . Fear of Current or Ex-Partner:   . Emotionally Abused:   Marland Kitchen Physically Abused:   . Sexually Abused:     REVIEW OF SYSTEMS: Constitutional: No fevers, chills, or sweats, no generalized fatigue, change in appetite Eyes: No visual changes, double vision, eye pain Ear, nose and throat: No hearing loss, ear pain, nasal congestion, sore throat Cardiovascular: No chest pain, palpitations Respiratory:  No shortness of breath at rest or with exertion, wheezes GastrointestinaI: No nausea, vomiting, diarrhea, abdominal  pain, fecal incontinence Genitourinary:  No dysuria, urinary retention or frequency Musculoskeletal:  No neck pain, back pain Integumentary: No rash, pruritus, skin lesions Neurological: as above Psychiatric: No depression, insomnia, anxiety Endocrine: No palpitations, fatigue, diaphoresis, mood swings, change in appetite, change in weight, increased thirst Hematologic/Lymphatic:  No purpura, petechiae. Allergic/Immunologic: no itchy/runny eyes, nasal congestion, recent allergic reactions, rashes  PHYSICAL EXAM: *** General: No acute distress.  Patient appears well-groomed.   Head:  Normocephalic/atraumatic Eyes:  Fundi examined but not visualized Neck: supple, no paraspinal tenderness, full range of motion Heart:  Regular rate and rhythm Lungs:  Clear to auscultation bilaterally Back: No paraspinal tenderness Neurological Exam: alert and oriented to person, place, and time. Attention span and concentration intact, recent and remote memory intact, fund of knowledge intact.  Speech fluent and not dysarthric, language intact.  CN II-XII intact. Bulk and tone normal, muscle strength 5/5 throughout.  Sensation to light touch  intact.  Deep tendon reflexes 2+ throughout.  Finger to nose testing intact.  Gait normal, Romberg negative.  IMPRESSION: Migraine without aura, without status migrainosus, not intractable  PLAN: 1.  For preventative management, topiramate 75mg  at bedtime 2.  For abortive therapy, Maxalt, icy patch 3.  Limit use of pain relievers to no more than 2 days out of week to prevent risk of rebound or medication-overuse headache. 4.  Keep headache diary 5.  Exercise, hydration, caffeine cessation, sleep hygiene, monitor for and avoid triggers 6. Follow up ***   , DO  CC: ***

## 2019-07-18 ENCOUNTER — Ambulatory Visit: Payer: Medicaid Other | Admitting: Neurology

## 2019-08-01 ENCOUNTER — Encounter: Payer: Self-pay | Admitting: Orthopaedic Surgery

## 2019-08-01 ENCOUNTER — Ambulatory Visit (INDEPENDENT_AMBULATORY_CARE_PROVIDER_SITE_OTHER): Payer: Medicaid Other | Admitting: Orthopaedic Surgery

## 2019-08-01 ENCOUNTER — Ambulatory Visit (INDEPENDENT_AMBULATORY_CARE_PROVIDER_SITE_OTHER): Payer: Medicaid Other

## 2019-08-01 ENCOUNTER — Other Ambulatory Visit: Payer: Self-pay

## 2019-08-01 DIAGNOSIS — M545 Low back pain, unspecified: Secondary | ICD-10-CM

## 2019-08-01 MED ORDER — METHOCARBAMOL 500 MG PO TABS: 500.0000 mg | ORAL_TABLET | Freq: Two times a day (BID) | ORAL | 0 refills | Status: DC | PRN

## 2019-08-01 MED ORDER — PREDNISONE 10 MG (21) PO TBPK: ORAL_TABLET | ORAL | 0 refills | Status: DC

## 2019-08-01 NOTE — Progress Notes (Signed)
Office Visit Note   Patient: Rachel Mcclure           Date of Birth: [birth year]           MRN: [id number] Visit Date: 08/01/2019              Requested by: Anderson, Chelsey L, DO [address] [place],  [place] [address] PCP: Richarda Osmond, DO   Assessment & Plan: Visit Diagnoses:  1. Low back pain, unspecified back pain laterality, unspecified chronicity, unspecified whether sciatica present     Plan: Impression is chronic low back pain.  I started the patient on a Sterapred taper muscle relaxer.  We will also start her in physical therapy.  Internal prescription was sent in.  She will follow-up with [place] in 8 weeks time for recheck.  Follow-Up Instructions: Return in about 8 weeks (around 09/26/2019), or if symptoms worsen or fail to improve.   Orders:  Orders Placed This Encounter  Procedures  . XR Lumbar Spine 2-3 Views  . Ambulatory referral to Physical Therapy   Meds ordered this encounter  Medications  . predniSONE (STERAPRED UNI-PAK 21 TAB) 10 MG (21) TBPK tablet    Sig: Take as directed    Dispense:  21 tablet    Refill:  0  . methocarbamol (ROBAXIN) 500 MG tablet    Sig: Take 1 tablet (500 mg total) by mouth 2 (two) times daily as needed.    Dispense:  20 tablet    Refill:  0      Procedures: No procedures performed   Clinical Data: No additional findings.   Subjective: Chief Complaint  Patient presents with  . Lower Back - Pain    HPI patient is a pleasant 24 year old female who comes in today with midline lower back pain for the past month.  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  She also notes that she has been jumping on a trampoline more frequently with her child.  The pain is primarily to the middle of the lower back and does not radiate down either leg.  This seems to be worse when she is sitting for a long time and goes to stand up as  well as when she flexes her lumbar spine.  Nothing seems to improve her symptoms.  She denies any numbness, tingling or burning.  No weakness.  No previous lumbar pathology.  Review of Systems as detailed in HPI.  All others reviewed and are negative.   Objective: Vital Signs: There were no vitals taken for this visit.  Physical Exam well-developed well-nourished female no acute distress.  Alert oriented x3.  Ortho Exam examination of her lumbar spine reveals mild tenderness to palpation over the midline in bilateral lower back.  She has increased pain with lumbar flexion.  Positive straight leg raise both sides.  No focal weakness.  She is neurovascular intact distally.  Specialty Comments:  No specialty comments available.  Imaging: XR Lumbar Spine 2-3 Views  Result Date: 08/01/2019 No acute or structural abnormalities    PMFS History: Patient Active Problem List   Diagnosis Date Noted  . Cough 08/10/2017  . Allergic rhinitis 07/25/2017  . Feeling tired 01/04/2017  . Panic attack as reaction to stress 02/17/2014  . GERD (gastroesophageal reflux disease) 01/23/2014   Past Medical History:  Diagnosis Date  . GERD (gastroesophageal reflux disease)   . Migraines   .  Panic attacks     Family History  Problem Relation Age of Onset  . Hyperthyroidism Mother   . Asthma Brother   . Hypertension Maternal Grandmother   . Hyperthyroidism Maternal Grandmother     Past Surgical History:  Procedure Laterality Date  . CESAREAN SECTION  11/23/2011   Procedure: CESAREAN SECTION;  Surgeon: Antionette Char, MD;  Location: [facility];  Service: Gynecology;  Laterality: N/A;  Primary Cesarean Section Delivery Boy @ 0240,  Apgars 6/8   Social History   Occupational History  . Not on file  Tobacco Use  . Smoking status: Never Smoker  . Smokeless tobacco: Never Used  Substance and Sexual Activity  . Alcohol use: No  . Drug use: No  . Sexual activity: Yes    Partners: Male    Birth  control/protection: I.U.D.

## 2019-08-05 ENCOUNTER — Ambulatory Visit: Payer: Medicaid Other | Attending: Physician Assistant

## 2019-08-05 ENCOUNTER — Other Ambulatory Visit: Payer: Self-pay

## 2019-08-05 DIAGNOSIS — M62830 Muscle spasm of back: Secondary | ICD-10-CM | POA: Insufficient documentation

## 2019-08-05 DIAGNOSIS — M6281 Muscle weakness (generalized): Secondary | ICD-10-CM | POA: Insufficient documentation

## 2019-08-05 DIAGNOSIS — M545 Low back pain, unspecified: Secondary | ICD-10-CM

## 2019-08-05 NOTE — Therapy (Signed)
[place], [place], [address] Phone: [phone]   Fax:  [phone]  Physical Therapy Evaluation  Patient Details  Name: SINIYA LICHTY MRN: [id number] Date of Birth: 1996-03-31 Referring Provider (PT): Dwana Melena, [place]   Encounter Date: 08/05/2019  PT End of Session - 08/05/19 1000    Visit Number  1    Number of Visits  12    Date for PT Re-Evaluation  09/19/19    Authorization Type  NCD    PT Start Time  1000    PT Stop Time  1045    PT Time Calculation (min)  45 min    Activity Tolerance  Patient tolerated treatment well    Behavior During Therapy  [facility] for tasks assessed/performed       Past Medical History:  Diagnosis Date  . GERD (gastroesophageal reflux disease)   . Migraines   . Panic attacks     Past Surgical History:  Procedure Laterality Date  . CESAREAN SECTION  11/23/2011   Procedure: CESAREAN SECTION;  Surgeon: Lahoma Crocker, MD;  Location: [facility];  Service: Gynecology;  Laterality: N/A;  Primary Cesarean Section Delivery Boy @ 2426,  Apgars 6/8    There were no vitals filed for this visit.   Subjective Assessment - 08/05/19 1005    Subjective  She reports  sitting in car lower back is painful. Bending also hurts.  She works cleaning and lifting.   Started 2-3 months ago. Worsening    Limitations  Sitting;Lifting;House hold activities    How long can you sit comfortably?  varies  30 min max    How long can you walk comfortably?  As needed    Diagnostic tests  xray: negative    Patient Stated Goals  eliminate pain    Currently in Pain?  Yes    Pain Score  3     Pain Location  Back    Pain Orientation  Right;Lower    Pain Descriptors / Indicators  Aching   pinch   Pain Type  Chronic pain    Pain Onset  More than a month ago    Pain Frequency  Intermittent    Aggravating Factors   sitting , bendiung and lifting    Pain Relieving Factors  Medication ,rest          [facility] PT Assessment - 08/05/19 0001      Assessment   Medical Diagnosis  LBP    Referring Provider (PT)  Dwana Melena, PA    Onset Date/Surgical Date  --   about 2 months ago   Next MD Visit  as needed    Prior Therapy  no      Precautions   Precautions  None      Restrictions   Weight Bearing Restrictions  No      Balance Screen   Has the patient fallen in the past 6 months  No      Prior Function   Level of Independence  Independent      Cognition   Overall Cognitive Status  Within Functional Limits for tasks assessed      Posture/Postural Control   Posture Comments  RT shoulder lower  decr rounding in lower spine with flexion , supine long LT leg      ROM / Strength   AROM / PROM / Strength  AROM;PROM;Strength      AROM   Overall AROM Comments  All lumbar  ROM painful  flex more than ext     AROM Assessment Site  Lumbar    Lumbar Flexion  65    Lumbar Extension  5    Lumbar - Right Side Bend  15    Lumbar - Left Side Bend  15      PROM   Overall PROM Comments  LE WNL   LTR WNL      Strength   Overall Strength Comments  LE WNL      Flexibility   Soft Tissue Assessment /Muscle Length  yes    Hamstrings  80 degrees bilateral    ITB  + ITB on RT and some mild tihghtness RT hip IR      Palpation   Palpation comment  Tender prxilamal LT SI and across lower lumbar RT <LT      Ambulation/Gait   Gait Comments  Normal                Objective measurements completed on examination: See above findings.              PT Education - 08/05/19 1002    Education Details  POC <HEP    Person(s) Educated  Patient    Methods  Explanation;Tactile cues;Verbal cues;Handout    Comprehension  Verbalized understanding;Returned demonstration       PT Short Term Goals - 08/05/19 1000      PT SHORT TERM GOAL #1   Title  She will  be independent with intial HEP    Baseline  no program    Time  3    Period  Weeks    Status  New      PT SHORT  TERM GOAL #2   Title  She will report pain decreased 20% or more in lowetr back    Baseline  Varies 3-7/10 depending on activity    Time  3    Period  Weeks    Status  New      PT SHORT TERM GOAL #3   Title  Sit for 45 min before pain incr    Baseline  30 min max    Time  3    Period  Weeks    Status  New                Plan - 08/05/19 1012    Personal Factors and Comorbidities  Time since onset of injury/illness/exacerbation    Examination-Activity Limitations  Sit;Carry;Lift;Bend    Examination-Participation Restrictions  Cleaning;Community Activity;Meal Prep;Laundry    Stability/Clinical Decision Making  Evolving/Moderate complexity    Clinical Decision Making  Moderate    Rehab Potential  Good    PT Frequency  --   3 visits   PT Duration  --   over 2-3 weeks then 2x/week for 4 weeks   PT Treatment/Interventions  Electrical Stimulation;Moist Heat;Therapeutic exercise;Therapeutic activities;Patient/family education;Manual techniques;Passive range of motion;Taping;Dry needling    PT Next Visit Plan  modalities and manual , Review HEP and use of belt around pelvis  Add to HEP    PT Home Exercise Plan  PPT , SKC, LTR, use pelt for pelvis compression    Consulted and Agree with Plan of Care  Patient       Patient will benefit from skilled therapeutic intervention in order to improve the following deficits and impairments:  Pain, Postural dysfunction, Increased muscle spasms, Decreased range of motion, Decreased strength  Visit Diagnosis: Bilateral low back pain, unspecified chronicity, unspecified  whether sciatica present  Muscle spasm of back  Muscle weakness (generalized)     Problem List Patient Active Problem List   Diagnosis Date Noted  . Cough 08/10/2017  . Allergic rhinitis 07/25/2017  . Feeling tired 01/04/2017  . Panic attack as reaction to stress 02/17/2014  . GERD (gastroesophageal reflux disease) 01/23/2014    Caprice Red  PT 08/05/2019,  10:45 AM  [facility] [address] [place], [place], [address] Phone: [phone]   Fax:  [phone]  Name: TRISA CRANOR MRN: [id number] Date of Birth: [birth year]

## 2019-08-06 ENCOUNTER — Other Ambulatory Visit: Payer: Self-pay

## 2019-08-06 ENCOUNTER — Telehealth: Payer: Self-pay | Admitting: Orthopaedic Surgery

## 2019-08-06 DIAGNOSIS — M545 Low back pain, unspecified: Secondary | ICD-10-CM

## 2019-08-06 NOTE — Telephone Encounter (Signed)
Patient called crying stating she's having severe back pain and wanted and mri. And that the medication isn't working

## 2019-08-14 ENCOUNTER — Telehealth: Payer: Self-pay | Admitting: Physical Therapy

## 2019-08-14 ENCOUNTER — Ambulatory Visit: Payer: Medicaid Other

## 2019-08-14 NOTE — Telephone Encounter (Signed)
Spoke to Ms Caban and she reported her mother was sick and she could not come today. I asked her to call if she was not coming to her visit and she reported she will be here next week.

## 2019-08-19 ENCOUNTER — Ambulatory Visit: Payer: Medicaid Other

## 2019-08-25 ENCOUNTER — Ambulatory Visit: Payer: Medicaid Other

## 2019-09-04 ENCOUNTER — Other Ambulatory Visit: Payer: Medicaid Other

## 2019-12-02 DIAGNOSIS — B001 Herpesviral vesicular dermatitis: Secondary | ICD-10-CM | POA: Diagnosis not present

## 2019-12-02 DIAGNOSIS — Z3202 Encounter for pregnancy test, result negative: Secondary | ICD-10-CM | POA: Diagnosis not present

## 2020-04-05 ENCOUNTER — Other Ambulatory Visit: Payer: Medicaid Other

## 2020-04-05 DIAGNOSIS — Z20822 Contact with and (suspected) exposure to covid-19: Secondary | ICD-10-CM

## 2020-04-05 NOTE — Addendum Note (Signed)
Addended by: Royston Sinner B on: 04/05/2020 11:00 AM   Modules accepted: Orders

## 2020-04-06 ENCOUNTER — Encounter: Payer: Self-pay | Admitting: Orthopaedic Surgery

## 2020-04-06 LAB — SARS-COV-2, NAA 2 DAY TAT

## 2020-04-06 LAB — NOVEL CORONAVIRUS, NAA: SARS-CoV-2, NAA: NOT DETECTED

## 2020-04-07 ENCOUNTER — Other Ambulatory Visit: Payer: Medicaid Other

## 2020-04-12 DIAGNOSIS — R059 Cough, unspecified: Secondary | ICD-10-CM | POA: Diagnosis not present

## 2020-05-04 ENCOUNTER — Encounter: Payer: Self-pay | Admitting: Obstetrics

## 2020-05-04 ENCOUNTER — Other Ambulatory Visit: Payer: Self-pay

## 2020-05-04 ENCOUNTER — Ambulatory Visit (INDEPENDENT_AMBULATORY_CARE_PROVIDER_SITE_OTHER): Payer: Medicaid Other | Admitting: Obstetrics

## 2020-05-04 VITALS — BP 119/81 | HR 52 | Ht <= 58 in | Wt 143.2 lb

## 2020-05-04 DIAGNOSIS — B369 Superficial mycosis, unspecified: Secondary | ICD-10-CM

## 2020-05-04 MED ORDER — CLOTRIMAZOLE-BETAMETHASONE 1-0.05 % EX CREA
1.0000 | TOPICAL_CREAM | Freq: Two times a day (BID) | CUTANEOUS | 0 refills | Status: DC
Start: 2020-05-04 — End: 2020-11-15

## 2020-05-04 NOTE — Progress Notes (Signed)
Patient complains of having right side nipple, itching, and heaviness. She denies having any discharge. Patient states that her sx started when her cycle came on.   Last pap 04/09/20

## 2020-05-04 NOTE — Progress Notes (Signed)
Patient ID: Rachel Mcclure, female   DOB: November 01, 1995, 25 y.o.   MRN: [id number]  Chief Complaint  Patient presents with  . Breast Problem    HPI Rachel Mcclure is a 25 y.o. female.  Complains of itching and pain in right breast under the nipple over the past week.  Denies fever / chills, masses or redness of the breast.  Denies nipple discharge. HPI  Past Medical History:  Diagnosis Date  . GERD (gastroesophageal reflux disease)   . Migraines   . Panic attacks     Past Surgical History:  Procedure Laterality Date  . CESAREAN SECTION  11/23/2011   Procedure: CESAREAN SECTION;  Surgeon: Antionette Char, MD;  Location: [facility];  Service: Gynecology;  Laterality: N/A;  Primary Cesarean Section Delivery Boy @ 0240,  Apgars 6/8    Family History  Problem Relation Age of Onset  . Hyperthyroidism Mother   . Asthma Brother   . Hypertension Maternal Grandmother   . Hyperthyroidism Maternal Grandmother     Social History Social History   Tobacco Use  . Smoking status: Never Smoker  . Smokeless tobacco: Never Used  Vaping Use  . Vaping Use: Never used  Substance Use Topics  . Alcohol use: No  . Drug use: No    Allergies  Allergen Reactions  . Topamax [Topiramate]     Current Outpatient Medications  Medication Sig Dispense Refill  . clotrimazole-betamethasone (LOTRISONE) cream Apply 1 application topically 2 (two) times daily. 30 g 0  . ibuprofen (ADVIL,MOTRIN) 200 MG tablet Take 400 mg by mouth every 6 (six) hours as needed for headache or mild pain.    Marland Kitchen levonorgestrel (MIRENA) 20 MCG/24HR IUD 1 each by Intrauterine route once. 04/24/2012    . PARoxetine (PAXIL) 20 MG tablet Take 1 tablet (20 mg total) by mouth daily. (Patient not taking: No sig reported) 30 tablet 11   No current facility-administered medications for this visit.    Review of Systems Review of Systems Constitutional: negative for fatigue and weight loss Respiratory: negative for cough and  wheezing Cardiovascular: negative for chest pain, fatigue and palpitations Gastrointestinal: negative for abdominal pain and change in bowel habits Genitourinary:negative Integument/breast: rpositive for itching and pain in the right breast under the nipple area Neurological: negative for gait problems and tremors Behavioral/Psych: negative for abusive relationship, depression Endocrine: negative for temperature intolerance      Blood pressure 119/81, pulse (!) 52, height 4\' 3"  (1.295 m), weight 143 lb 3.2 oz (65 kg), last menstrual period 04/25/2020.  Physical Exam Physical Exam General:   alert and no distress  Skin:   no rash or abnormalities  Lungs:   clear to auscultation bilaterally  Heart:   regular rate and rhythm, S1, S2 normal, no murmur, click, rub or gallop  Breasts:   normal without suspicious masses, skin or nipple changes or axillary nodes  Abdomen:  normal findings: no organomegaly, soft, non-tender and no hernia     50% of 15 min visit spent on counseling and coordination of care.   Data Reviewed Labs  Assessment     1. Superficial fungus infection of skin Rx: - clotrimazole-betamethasone (LOTRISONE) cream; Apply 1 application topically 2 (two) times daily.  Dispense: 30 g; Refill: 0    Plan    Follow up in 2 weeks  Meds ordered this encounter  Medications  . clotrimazole-betamethasone (LOTRISONE) cream    Sig: Apply 1 application topically 2 (two) times daily.    Dispense:  30 g    Refill:  0     Brock Bad, MD 05/04/2020 10:21 AM

## 2020-05-05 ENCOUNTER — Telehealth: Payer: Self-pay | Admitting: Student in an Organized Health Care Education/Training Program

## 2020-05-05 NOTE — Telephone Encounter (Signed)
Pt received pfizer vaccine 1st dose on 01/29/2020 and 2nd dose on 02/19/2020 at [facility] on [address] in [place]. Pt will go back to [facility] to get booster when it is time

## 2020-05-18 ENCOUNTER — Ambulatory Visit (INDEPENDENT_AMBULATORY_CARE_PROVIDER_SITE_OTHER): Payer: Medicaid Other | Admitting: Obstetrics

## 2020-05-18 ENCOUNTER — Encounter: Payer: Self-pay | Admitting: Obstetrics

## 2020-05-18 ENCOUNTER — Other Ambulatory Visit: Payer: Self-pay

## 2020-05-18 VITALS — BP 112/74 | HR 55 | Wt 149.0 lb

## 2020-05-18 DIAGNOSIS — N946 Dysmenorrhea, unspecified: Secondary | ICD-10-CM

## 2020-05-18 DIAGNOSIS — Z6841 Body Mass Index (BMI) 40.0 and over, adult: Secondary | ICD-10-CM | POA: Diagnosis not present

## 2020-05-18 DIAGNOSIS — B3731 Acute candidiasis of vulva and vagina: Secondary | ICD-10-CM

## 2020-05-18 DIAGNOSIS — B373 Candidiasis of vulva and vagina: Secondary | ICD-10-CM

## 2020-05-18 MED ORDER — FLUCONAZOLE 150 MG PO TABS: 150.0000 mg | ORAL_TABLET | Freq: Once | ORAL | 0 refills | Status: AC

## 2020-05-18 MED ORDER — IBUPROFEN 800 MG PO TABS: 800.0000 mg | ORAL_TABLET | Freq: Three times a day (TID) | ORAL | 5 refills | Status: DC | PRN

## 2020-05-18 MED ORDER — PHENTERMINE HCL 37.5 MG PO CAPS: 37.5000 mg | ORAL_CAPSULE | ORAL | 2 refills | Status: DC

## 2020-05-18 NOTE — Patient Instructions (Signed)
https://www.womenshealth.gov/menopause/menopause-basics"> https://www.clinicalkey.com">  Menopausia Menopause La menopausia es el perodo normal de la vida de una mujer en el que los perodos menstruales cesan por completo. Marca el fin natural de la capacidad de [place] mujer de [place]. Se puede definir como la ausencia del perodo menstrual durante 12 meses sin otra causa mdica. La transicin a Secondary school teacher (perimenopausia) con mayor frecuencia ocurre entre los 45 y [place], y puede durar [employer]. Durante la perimenopausia, los niveles hormonales cambian en el [place], lo que puede provocar sntomas y Ship broker. La menopausia podra aumentar el riesgo de sufrir lo siguiente:  Huesos debilitados (osteoporosis), lo cual causa fracturas.  Depresin.  Endurecimiento y Scientist, research (medical) de las arterias (aterosclerosis), lo que puede causar infartos de miocardio y accidentes cerebrovasculares. Cules son las causas? A menudo, la causa de esta afeccin es un cambio natural en los niveles hormonales, que ocurre a medida que envejece. La menopausia tambin puede ser causada por cambios que no son naturales, entre ellos:  [place] para extirpar ambos ovarios (menopausia [place]).  Efectos secundarios de [employer], como la quimioterapia [place] para tratar el cncer (menopausia qumica). Qu incrementa el riesgo? Es ms probable que la menopausia comience a una edad ms temprana si tiene ciertas afecciones o se ha realizado ciertos tratamientos, incluidos los siguientes:  Un tumor en la hipfisis del cerebro.  Una enfermedad que afecte los ovarios y las hormonas.  Ciertos tratamientos para el cncer, como quimioterapia o terapia hormonal, o radioterapia en la pelvis.  Fumar mucho y consumir alcohol de forma excesiva.  Antecedentes familiares de menopausia temprana. Adems, es ms probable que esta afeccin se presente de manera temprana en las mujeres que  son [place]. Cules son los signos o sntomas? Los sntomas de esta afeccin incluyen:  Engineer, maintenance.  Perodos menstruales irregulares.  Sudoracin nocturna.  Cambios en el sentimiento respecto de las [employer]. Es posible que el deseo sexual disminuya o que se sienta ms incmoda respecto de su sexualidad.  Sequedad vaginal y adelgazamiento de las paredes de la vagina. Esto puede causar [employer].  Sequedad de la piel y aparicin de Banker.  Dolores de [place].  Problemas para dormir (insomnio).  Cambios de humor o irritabilidad.  Problemas de memoria.  Aumento de [place].  Crecimiento de vello en la cara y el pecho.  Infecciones en la vejiga o problemas para orinar. Cmo se diagnostica? Esta afeccin se diagnostica en funcin de los antecedentes mdicos, un examen fsico, la edad, los antecedentes menstruales y los sntomas. Tambin le podran realizar estudios hormonales. Cmo se trata? En algunos los casos, no se necesita tratamiento. Usted y el mdico deben decidir juntos si se debe Pensions consultant. El tratamiento se determinar en funcin de su cuadro clnico y de sus preferencias. El tratamiento de este cuadro clnico se centra en el control de los sntomas. El tratamiento puede incluir:  Terapia hormonal para la menopausia.  Medicamentos para tratar sntomas o complicaciones especficos.  Acupuntura.  Vitaminas o suplementos herbales. Antes de Microbiologist, es importante que le avise al mdico si tiene antecedentes personales o familiares de estas afecciones:  Enfermedad cardaca.  Cncer de mama.  Cogulos de [place].  Diabetes.  Osteoporosis. Siga estas instrucciones en su casa: Estilo de vida  No consuma ningn producto que contenga nicotina o tabaco, como cigarrillos, cigarrillos electrnicos y tabaco de Theatre manager. Si necesita ayuda para dejar de consumir estos productos, consulte al  mdico.  Realice, por lo menos, de  actividad fsica 5das por semana o ms.  Evite las bebidas con alcohol o cafena, as como las [employer] Esto podra ayudar a prevenir los acaloramientos.  Intente dormir de 7 a 8horas todas las noches.  Si tiene acaloramientos: ? Vstase en capas. ? Evite las cosas que podran [employer] acaloramientos, como las comidas muy condimentadas, los lugares calientes o el estrs. ? Respire profundamente y despacio cuando comience a Scientist, water quality. ? Tenga un ventilador en su casa y en su oficina.  Encuentre modos de [employer], por ejemplo, a travs de la respiracin, meditacin o un diario ntimo.  Considere la posibilidad de asistir a terapia grupal con otras mujeres que tengan sntomas de [place]. [place] recomendaciones al [employer] reuniones de terapia grupal. Comida y bebida  Siga una dieta saludable y equilibrada que incluya cereales integrales, protenas magras, productos lcteos descremados y [place] frutas y verduras.  El mdico podra recomendarle que agregue una mayor cantidad de soja a su dieta. Algunos de los alimentos que contienen soja son el tofu, el tempeh y la [place] de soja.  Consuma muchos alimentos que contengan calcio y vitaminaD para Nutritional therapist salud sea. Algunos productos que contienen mucho calcio son los [employer], el yogur, los frijoles, las [place], las sardinas, el brcoli y la col rizada.   Medicamentos  Use los medicamentos de venta libre y los recetados solamente como se lo haya indicado el mdico.  Hable con el mdico antes de Corporate investment banker a tomar cualquier suplemento herbal. Tome las vitaminas y los suplementos recetados como se lo haya indicado el mdico. Indicaciones generales  Lleve un registro de sus perodos menstruales; incluya lo siguiente: ? El momento en que ocurren. ? Qu tan abundantes son y cunto duran. ? Cunto tiempo transcurre entre cada  perodo menstrual.  Lleve un registro de los sntomas; anote cundo comienzan, con qu frecuencia ocurren y cunto duran.  Use lubricantes o humectantes vaginales para aliviar la sequedad vaginal y Personnel officer durante las relaciones sexuales.  Cumpla con todas las visitas de seguimiento. Esto es importante. Esto incluye la terapia grupal y la psicoterapia.   Comunquese con un mdico si:  An tiene perodos menstruales despus de los 55aos.  Siente dolor durante las [employer].  No tuvo perodos menstruales durante los ltimos y presenta sangrado vaginal. Solicite ayuda de inmediato si tiene:  Depresin grave.  Sangrado vaginal excesivo.  Dolor al Beatrix Shipper.  Latidos cardacos rpidos o irregulares (palpitaciones).  Dolor de cabeza intenso.  Dolor en el abdomen o indigestin grave. Resumen  La menopausia es un perodo normal de la vida de Nurse, mental health en el que los perodos menstruales cesan por completo. Se define normalmente como la ausencia del periodo menstrual durante 12 meses sin otra causa mdica.  La transicin a Secondary school teacher (perimenopausia) con mayor frecuencia ocurre entre los 45 y [place], y puede durar varios aos.  Los sntomas pueden controlarse mediante medicamentos, cambios en el estilo de vida y terapias complementarias, como acupuntura.  Siga una dieta equilibrada que incluya muchos nutrientes para Biochemist, clinical salud sea y la salud cardaca, y para Chief Operating Officer los sntomas durante la [place]. Esta informacin no tiene Theme park manager el consejo del mdico. Asegrese de hacerle al mdico cualquier pregunta que tenga. Document Revised: 10/21/2019 Document Reviewed: 10/21/2019 Elsevier Patient Education  2021 Elsevier Inc.  https://www.womenshealth.gov/menopause/menopause-basics"> https://www.clinicalkey.com">  Menopausia Menopause La menopausia es el perodo normal de la vida de una mujer en el que los perodos [employer]  cesan por completo. Marca el fin natural de la capacidad de [place] mujer de [place]. Se puede definir como la ausencia del perodo menstrual durante 12 meses sin otra causa mdica. La transicin a Secondary school teacher (perimenopausia) con mayor frecuencia ocurre entre los 45 y [place], y puede durar [employer]. Durante la perimenopausia, los niveles hormonales cambian en el [place], lo que puede provocar sntomas y Ship broker. La menopausia podra aumentar el riesgo de sufrir lo siguiente:  Huesos debilitados (osteoporosis), lo cual causa fracturas.  Depresin.  Endurecimiento y Scientist, research (medical) de las arterias (aterosclerosis), lo que puede causar infartos de miocardio y accidentes cerebrovasculares. Cules son las causas? A menudo, la causa de esta afeccin es un cambio natural en los niveles hormonales, que ocurre a medida que envejece. La menopausia tambin puede ser causada por cambios que no son naturales, entre ellos:  [place] para extirpar ambos ovarios (menopausia [place]).  Efectos secundarios de [employer], como la quimioterapia [place] para tratar el cncer (menopausia qumica). Qu incrementa el riesgo? Es ms probable que la menopausia comience a una edad ms temprana si tiene ciertas afecciones o se ha realizado ciertos tratamientos, incluidos los siguientes:  Un tumor en la hipfisis del cerebro.  Una enfermedad que afecte los ovarios y las hormonas.  Ciertos tratamientos para el cncer, como quimioterapia o terapia hormonal, o radioterapia en la pelvis.  Fumar mucho y consumir alcohol de forma excesiva.  Antecedentes familiares de menopausia temprana. Adems, es ms probable que esta afeccin se presente de manera temprana en las mujeres que son [place]. Cules son los signos o sntomas? Los sntomas de esta afeccin incluyen:  Engineer, maintenance.  Perodos menstruales irregulares.  Sudoracin nocturna.  Cambios en el sentimiento  respecto de las [employer]. Es posible que el deseo sexual disminuya o que se sienta ms incmoda respecto de su sexualidad.  Sequedad vaginal y adelgazamiento de las paredes de la vagina. Esto puede causar [employer].  Sequedad de la piel y aparicin de Banker.  Dolores de [place].  Problemas para dormir (insomnio).  Cambios de humor o irritabilidad.  Problemas de memoria.  Aumento de [place].  Crecimiento de vello en la cara y el pecho.  Infecciones en la vejiga o problemas para orinar. Cmo se diagnostica? Esta afeccin se diagnostica en funcin de los antecedentes mdicos, un examen fsico, la edad, los antecedentes menstruales y los sntomas. Tambin le podran realizar estudios hormonales. Cmo se trata? En algunos los casos, no se necesita tratamiento. Usted y el mdico deben decidir juntos si se debe Pensions consultant. El tratamiento se determinar en funcin de su cuadro clnico y de sus preferencias. El tratamiento de este cuadro clnico se centra en el control de los sntomas. El tratamiento puede incluir:  Terapia hormonal para la menopausia.  Medicamentos para tratar sntomas o complicaciones especficos.  Acupuntura.  Vitaminas o suplementos herbales. Antes de Microbiologist, es importante que le avise al mdico si tiene antecedentes personales o familiares de estas afecciones:  Enfermedad cardaca.  Cncer de mama.  Cogulos de [place].  Diabetes.  Osteoporosis. Siga estas instrucciones en su casa: Estilo de vida  No consuma ningn producto que contenga nicotina o tabaco, como cigarrillos, cigarrillos electrnicos y tabaco de Theatre manager. Si necesita ayuda para dejar de consumir estos productos, consulte al mdico.  Realice, por lo menos, de actividad fsica 5das por semana o ms.  Evite las bebidas con alcohol o cafena, as como las [employer]. Esto podra ayudar  a Academic librarianprevenir los  acaloramientos.  Intente dormir de 7 a 8horas todas las noches.  Si tiene acaloramientos: ? Vstase en capas. ? Evite las cosas que podran Barnes & Nobledesencadenar los acaloramientos, como las comidas muy condimentadas, los lugares calientes o el estrs. ? Respire profundamente y despacio cuando comience a Scientist, water qualitysentir acaloramiento. ? Tenga un ventilador en su casa y en su oficina.  Encuentre modos de MGM MIRAGEcontrolar el estrs, por ejemplo, a travs de la respiracin, meditacin o un diario ntimo.  Considere la posibilidad de asistir a terapia grupal con otras mujeres que tengan sntomas de [place]ia. Pdale recomendaciones al The Procter & Gamblemdico sobre reuniones de terapia grupal. Comida y bebida  Siga una dieta saludable y equilibrada que incluya cereales integrales, protenas magras, productos lcteos descremados y [place] Castlemuchas frutas y verduras.  El mdico podra recomendarle que agregue una mayor cantidad de soja a su dieta. Algunos de los alimentos que contienen soja son el tofu, el tempeh y la [place] de soja.  Consuma muchos alimentos que contengan calcio y vitaminaD para Nutritional therapistmejorar la salud sea. Algunos productos que contienen mucho calcio son los Enterprise Productslcteos descremados, el yogur, los frijoles, las Frederikaalmendras, las sardinas, el brcoli y la col rizada.   Medicamentos  Use los medicamentos de venta libre y los recetados solamente como se lo haya indicado el mdico.  Hable con el mdico antes de Corporate investment bankerempezar a tomar cualquier suplemento herbal. Tome las vitaminas y los suplementos recetados como se lo haya indicado el mdico. Indicaciones generales  Lleve un registro de sus perodos menstruales; incluya lo siguiente: ? El momento en que ocurren. ? Qu tan abundantes son y cunto duran. ? Cunto tiempo transcurre entre cada perodo menstrual.  Lleve un registro de los sntomas; anote cundo comienzan, con qu frecuencia ocurren y cunto duran.  Use lubricantes o humectantes vaginales para aliviar la sequedad vaginal y Education officer, environmentalmejorar  el bienestar durante las relaciones sexuales.  Cumpla con todas las visitas de seguimiento. Esto es importante. Esto incluye la terapia grupal y la psicoterapia.   Comunquese con un mdico si:  An tiene perodos menstruales despus de los 55aos.  Siente dolor durante las The St. Paul Travelersrelaciones sexuales.  No tuvo perodos menstruales durante los ltimos 12meses y presenta sangrado vaginal. Solicite ayuda de inmediato si tiene:  Depresin grave.  Sangrado vaginal excesivo.  Dolor al Beatrix Shipperorinar.  Latidos cardacos rpidos o irregulares (palpitaciones).  Dolor de cabeza intenso.  Dolor en el abdomen o indigestin grave. Resumen  La menopausia es un perodo normal de la vida de Nurse, mental healthuna mujer en el que los perodos menstruales cesan por completo. Se define normalmente como la ausencia del periodo menstrual durante 12 meses sin otra causa mdica.  La transicin a Secondary school teacherla menopausia (perimenopausia) con mayor frecuencia ocurre entre los 45 y [place] 55aos, y puede durar varios aos.  Los sntomas pueden controlarse mediante medicamentos, cambios en el estilo de vida y terapias complementarias, como acupuntura.  Siga una dieta equilibrada que incluya muchos nutrientes para Biochemist, clinicalfavorecer la salud sea y la salud cardaca, y para Chief Operating Officercontrolar los sntomas durante la [place]opaus[place]. Esta informacin no tiene Theme park managercomo fin reemplazar el consejo del mdico. Asegrese de hacerle al mdico cualquier pregunta que tenga. Document Revised: 10/21/2019 Document Reviewed: 10/21/2019 Elsevier Patient Education  2021 Arvin[employer]vier Inc.

## 2020-05-18 NOTE — Progress Notes (Signed)
Patient ID: Rachel Mcclure, female   DOB: 18-Nov-1995, 25 y.o.   MRN: [id number]  Chief Complaint  Patient presents with  . Follow-up    HPI Rachel Mcclure is a 25 y.o. female.  Complains of vaginal discharge with irritation. HPI  Past Medical History:  Diagnosis Date  . GERD (gastroesophageal reflux disease)   . Migraines   . Panic attacks     Past Surgical History:  Procedure Laterality Date  . CESAREAN SECTION  11/23/2011   Procedure: CESAREAN SECTION;  Surgeon: Antionette Char, MD;  Location: [facility];  Service: Gynecology;  Laterality: N/A;  Primary Cesarean Section Delivery Boy @ 0240,  Apgars 6/8    Family History  Problem Relation Age of Onset  . Hyperthyroidism Mother   . Asthma Brother   . Hypertension Maternal Grandmother   . Hyperthyroidism Maternal Grandmother     Social History Social History   Tobacco Use  . Smoking status: Never Smoker  . Smokeless tobacco: Never Used  Vaping Use  . Vaping Use: Never used  Substance Use Topics  . Alcohol use: No  . Drug use: No    Allergies  Allergen Reactions  . Topamax [Topiramate]     Current Outpatient Medications  Medication Sig Dispense Refill  . clotrimazole-betamethasone (LOTRISONE) cream Apply 1 application topically 2 (two) times daily. 30 g 0  . fluconazole (DIFLUCAN) 150 MG tablet Take 1 tablet (150 mg total) by mouth once for 1 dose. 1 tablet 0  . ibuprofen (ADVIL) 800 MG tablet Take 1 tablet (800 mg total) by mouth every 8 (eight) hours as needed. 30 tablet 5  . phentermine 37.5 MG capsule Take 1 capsule (37.5 mg total) by mouth every morning. 30 capsule 2  . ibuprofen (ADVIL,MOTRIN) 200 MG tablet Take 400 mg by mouth every 6 (six) hours as needed for headache or mild pain.    Marland Kitchen levonorgestrel (MIRENA) 20 MCG/24HR IUD 1 each by Intrauterine route once. 04/24/2012    . PARoxetine (PAXIL) 20 MG tablet Take 1 tablet (20 mg total) by mouth daily. (Patient not taking: No sig reported) 30 tablet 11    No current facility-administered medications for this visit.    Review of Systems Review of Systems Constitutional: negative for fatigue and weight loss Respiratory: negative for cough and wheezing Cardiovascular: negative for chest pain, fatigue and palpitations Gastrointestinal: negative for abdominal pain and change in bowel habits Genitourinary: positive for vaginal discharge with irritation Integument/breast: negative for nipple discharge Musculoskeletal:negative for myalgias Neurological: negative for gait problems and tremors Behavioral/Psych: negative for abusive relationship, depression Endocrine: negative for temperature intolerance      Blood pressure 112/74, pulse (!) 55, weight 149 lb (67.6 kg), last menstrual period 04/25/2020.  Physical Exam Physical Exam General:   alert and no distress  Skin:   no rash or abnormalities  Lungs:   clear to auscultation bilaterally  Heart:   regular rate and rhythm, S1, S2 normal, no murmur, click, rub or gallop  Breasts:   normal without suspicious masses, skin or nipple changes or axillary nodes  Abdomen:  normal findings: no organomegaly, soft, non-tender and no hernia  Pelvis:  External genitalia: normal general appearance Urinary system: urethral meatus normal and bladder without fullness, nontender Vaginal: normal without tenderness, induration or masses Cervix: normal appearance Adnexa: normal bimanual exam Uterus: anteverted and non-tender, normal size    50% of 15 min visit spent on counseling and coordination of care.   Data Reviewed Wet prep and  Cultures  Assessment     1. Candida vaginitis Rx: - fluconazole (DIFLUCAN) 150 MG tablet; Take 1 tablet (150 mg total) by mouth once for 1 dose.  Dispense: 1 tablet; Refill: 0  2. Class 3 severe obesity due to excess calories without serious comorbidity with body mass index (BMI) of 40.0 to 44.9 in adult [facility]) Rx: - phentermine 37.5 MG capsule; Take 1 capsule (37.5 mg  total) by mouth every morning.  Dispense: 30 capsule; Refill: 2  3. Dysmenorrhea Rx: - ibuprofen (ADVIL) 800 MG tablet; Take 1 tablet (800 mg total) by mouth every 8 (eight) hours as needed.  Dispense: 30 tablet; Refill: 5    Plan   Follow up in 3 months for Annual / Pap   Meds ordered this encounter  Medications  . fluconazole (DIFLUCAN) 150 MG tablet    Sig: Take 1 tablet (150 mg total) by mouth once for 1 dose.    Dispense:  1 tablet    Refill:  0  . ibuprofen (ADVIL) 800 MG tablet    Sig: Take 1 tablet (800 mg total) by mouth every 8 (eight) hours as needed.    Dispense:  30 tablet    Refill:  5  . phentermine 37.5 MG capsule    Sig: Take 1 capsule (37.5 mg total) by mouth every morning.    Dispense:  30 capsule    Refill:  2      Brock Bad, MD 05/18/2020 11:27 AM

## 2020-08-06 DIAGNOSIS — H9202 Otalgia, left ear: Secondary | ICD-10-CM | POA: Insufficient documentation

## 2020-08-06 DIAGNOSIS — Z5321 Procedure and treatment not carried out due to patient leaving prior to being seen by health care provider: Secondary | ICD-10-CM | POA: Insufficient documentation

## 2020-08-07 ENCOUNTER — Emergency Department (HOSPITAL_COMMUNITY)
Admission: EM | Admit: 2020-08-07 | Discharge: 2020-08-07 | Disposition: A | Discharge status: Home / Self Care | Payer: Medicaid Other | Attending: Emergency Medicine | Admitting: Emergency Medicine

## 2020-08-07 ENCOUNTER — Encounter (HOSPITAL_COMMUNITY): Payer: Self-pay | Admitting: Emergency Medicine

## 2020-08-07 ENCOUNTER — Other Ambulatory Visit: Payer: Self-pay

## 2020-08-07 DIAGNOSIS — M26622 Arthralgia of left temporomandibular joint: Secondary | ICD-10-CM | POA: Diagnosis not present

## 2020-08-07 NOTE — ED Triage Notes (Signed)
Patient reports left ear ache this evening , denies injury or drainage , no hearing loss.

## 2020-08-07 NOTE — ED Notes (Signed)
Pt did not respond when called for treatment area, not seen inside or outside lobby

## 2020-08-09 ENCOUNTER — Telehealth: Payer: Self-pay

## 2020-08-09 NOTE — Telephone Encounter (Deleted)
Transition Care Management Follow-up Telephone Call  Date of discharge and from where: 08/09/2020 Rachel Mcclure  How have you been since you were released from the hospital? ***  Any questions or concerns? {YES/NO TOC TRACKING USE FOR MANAGED MEDICAID REPORTING:[id number]}  Items Reviewed:  Did the pt receive and understand the discharge instructions provided? {YES/NO:21197}  Medications obtained and verified? {YES/NO:21197}  Other? {YES/NO:21197}  Any new allergies since your discharge? {YES/NO:21197}  Dietary orders reviewed? {YES/NO TITLE CASE:22902}  Do you have support at home? {YES/NO:21197}  Home Care and Equipment/Supplies: Were home health services ordered? {Response; yes/no/na:63} If so, what is the name of the agency? ***  Has the agency set up a time to come to the patient's home? {Response; yes/no/na:63} Were any new equipment or medical supplies ordered?  {EAV/WU:[id number]} What is the name of the medical supply agency? *** Were you able to get the supplies/equipment? {Response; yes/no/na:63} Do you have any questions related to the use of the equipment or supplies? {GNF/AO:[id number]}  Functional Questionnaire: (I = Independent and D = Dependent) ADLs: ***  Bathing/Dressing- ***  Meal Prep- ***  Eating- ***  Maintaining continence- ***  Transferring/Ambulation- ***  Managing Meds- ***  Follow up appointments reviewed:   PCP Hospital f/u appt confirmed? {YES/NO:21197} Scheduled to see *** on *** @ ***.  Specialist Hospital f/u appt confirmed? {YES/NO:21197} Scheduled to see *** on *** @ ***.  Are transportation arrangements needed? {YES/NO:21197}  If their condition worsens, is the pt aware to call PCP or go to the Emergency Dept.? {YES/NO TITLE CASE:22902}  Was the patient provided with contact information for the PCP's office or ED? {YES/NO TITLE CASE:22902}  Was to pt encouraged to call back with questions or concerns? {YES/NO TITLE CASE:22902}

## 2020-08-09 NOTE — Telephone Encounter (Signed)
Patient left AMA and went to [facility].

## 2020-08-13 DIAGNOSIS — R1084 Generalized abdominal pain: Secondary | ICD-10-CM | POA: Diagnosis not present

## 2020-08-13 DIAGNOSIS — R072 Precordial pain: Secondary | ICD-10-CM | POA: Diagnosis not present

## 2020-11-15 ENCOUNTER — Ambulatory Visit (INDEPENDENT_AMBULATORY_CARE_PROVIDER_SITE_OTHER): Payer: Medicaid Other | Admitting: Obstetrics

## 2020-11-15 ENCOUNTER — Encounter: Payer: Self-pay | Admitting: Obstetrics

## 2020-11-15 ENCOUNTER — Other Ambulatory Visit (HOSPITAL_COMMUNITY)
Admission: RE | Admit: 2020-11-15 | Discharge: 2020-11-15 | Disposition: A | Discharge status: Home / Self Care | Payer: Medicaid Other | Source: Ambulatory Visit | Attending: Obstetrics | Admitting: Obstetrics

## 2020-11-15 ENCOUNTER — Other Ambulatory Visit: Payer: Self-pay

## 2020-11-15 VITALS — Ht 61.0 in | Wt 127.6 lb

## 2020-11-15 DIAGNOSIS — B373 Candidiasis of vulva and vagina: Secondary | ICD-10-CM

## 2020-11-15 DIAGNOSIS — N946 Dysmenorrhea, unspecified: Secondary | ICD-10-CM | POA: Diagnosis not present

## 2020-11-15 DIAGNOSIS — Z975 Presence of (intrauterine) contraceptive device: Secondary | ICD-10-CM | POA: Diagnosis not present

## 2020-11-15 DIAGNOSIS — B369 Superficial mycosis, unspecified: Secondary | ICD-10-CM | POA: Diagnosis not present

## 2020-11-15 DIAGNOSIS — N898 Other specified noninflammatory disorders of vagina: Secondary | ICD-10-CM

## 2020-11-15 DIAGNOSIS — B3731 Acute candidiasis of vulva and vagina: Secondary | ICD-10-CM

## 2020-11-15 DIAGNOSIS — Z01419 Encounter for gynecological examination (general) (routine) without abnormal findings: Secondary | ICD-10-CM | POA: Diagnosis not present

## 2020-11-15 MED ORDER — FLUCONAZOLE 150 MG PO TABS: 150.0000 mg | ORAL_TABLET | Freq: Once | ORAL | 2 refills | Status: AC

## 2020-11-15 MED ORDER — IBUPROFEN 800 MG PO TABS: 800.0000 mg | ORAL_TABLET | Freq: Three times a day (TID) | ORAL | 5 refills | Status: DC | PRN

## 2020-11-15 MED ORDER — CLOTRIMAZOLE-BETAMETHASONE 1-0.05 % EX CREA: 1.0000 "application " | TOPICAL_CREAM | Freq: Two times a day (BID) | CUTANEOUS | 2 refills | Status: DC

## 2020-11-15 NOTE — Progress Notes (Signed)
Subjective:        Rachel Mcclure is a 25 y.o. female here for a routine exam.  Current complaints: Vaginal discharge with itching.  Decreased libido.    Personal health questionnaire:  Is patient Ashkenazi Jewish, have a family history of breast and/or ovarian cancer: no Is there a family history of uterine cancer diagnosed at age < 78, gastrointestinal cancer, urinary tract cancer, family member who is a Personnel officer syndrome-associated carrier: no Is the patient overweight and hypertensive, family history of diabetes, personal history of gestational diabetes, preeclampsia or PCOS: no Is patient over 70, have PCOS,  family history of premature CHD under age 13, diabetes, smoke, have hypertension or peripheral artery disease:  no At any time, has a partner hit, kicked or otherwise hurt or frightened you?: no Over the past 2 weeks, have you felt down, depressed or hopeless?: no Over the past 2 weeks, have you felt little interest or pleasure in doing things?:no   Gynecologic History No LMP recorded. (Menstrual status: IUD). Contraception: IUD Last Pap: 04-10-2019. Results were: normal Last mammogram: n/a. Results were: n/a  Obstetric History OB History  Gravida Para Term Preterm AB Living  1 1 1  0 0 1  SAB IAB Ectopic Multiple Live Births  0 0 0   1    # Outcome Date GA Lbr Len/2nd Weight Sex Delivery Anes PTL Lv  1 Term 11/23/11 [redacted]w[redacted]d 19:15 / 04:25  M CS-LTranv EPI      Past Medical History:  Diagnosis Date   GERD (gastroesophageal reflux disease)    Migraines    Panic attacks     Past Surgical History:  Procedure Laterality Date   CESAREAN SECTION  11/23/2011   Procedure: CESAREAN SECTION;  Surgeon: 11/25/2011, MD;  Location: [facility];  Service: Gynecology;  Laterality: N/A;  Primary Cesarean Section Delivery Boy @ 0240,  Apgars 6/8     Current Outpatient Medications:    ibuprofen (ADVIL,MOTRIN) 200 MG tablet, Take 400 mg by mouth every 6 (six) hours as needed for  headache or mild pain., Disp: , Rfl:    clotrimazole-betamethasone (LOTRISONE) cream, Apply 1 application topically 2 (two) times daily. (Patient not taking: Reported on 11/15/2020), Disp: 30 g, Rfl: 0   levonorgestrel (MIRENA) 20 MCG/24HR IUD, 1 each by Intrauterine route once. 04/24/2012, Disp: , Rfl:    PARoxetine (PAXIL) 20 MG tablet, Take 1 tablet (20 mg total) by mouth daily. (Patient not taking: No sig reported), Disp: 30 tablet, Rfl: 11   phentermine 37.5 MG capsule, Take 1 capsule (37.5 mg total) by mouth every morning. (Patient not taking: Reported on 11/15/2020), Disp: 30 capsule, Rfl: 2 Allergies  Allergen Reactions   Topamax [Topiramate]     Social History   Tobacco Use   Smoking status: Never   Smokeless tobacco: Never  Substance Use Topics   Alcohol use: No    Family History  Problem Relation Age of Onset   Hyperthyroidism Mother    Asthma Brother    Hypertension Maternal Grandmother    Hyperthyroidism Maternal Grandmother       Review of Systems  Constitutional: negative for fatigue and weight loss Respiratory: negative for cough and wheezing Cardiovascular: negative for chest pain, fatigue and palpitations Gastrointestinal: negative for abdominal pain and change in bowel habits Musculoskeletal:negative for myalgias Neurological: negative for gait problems and tremors Behavioral/Psych: negative for abusive relationship, depression Endocrine: negative for temperature intolerance    Genitourinary:negative for abnormal menstrual periods, genital lesions, hot flashes,  sexual problems and vaginal discharge Integument/breast: negative for breast lump, breast tenderness, nipple discharge and skin lesion(s)    Objective:       Ht 5\' 1"  (1.549 m)   Wt 127 lb 9.6 oz (57.9 kg)   BMI 24.11 kg/m  General:   Alert and no distress  Skin:   no rash or abnormalities  Lungs:   clear to auscultation bilaterally  Heart:   regular rate and rhythm, S1, S2 normal, no murmur,  click, rub or gallop  Breasts:   normal without suspicious masses, skin or nipple changes or axillary nodes  Abdomen:  normal findings: no organomegaly, soft, non-tender and no hernia  Pelvis:  External genitalia: normal general appearance Urinary system: urethral meatus normal and bladder without fullness, nontender Vaginal: normal without tenderness, induration or masses Cervix: normal appearance.  IUD string visible, curled back over cervix Adnexa: normal bimanual exam Uterus: anteverted and non-tender, normal size   Lab Review Urine pregnancy test Labs reviewed yes Radiologic studies reviewed no  I have spent a total of 20 minutes of face-to-face and time, excluding clinical staff time, reviewing notes and preparing to see patient, ordering tests and/or medications, and counseling the patient.   Assessment:   1. Encounter for annual routine gynecological examination  2. Dysmenorrhea Rx: - ibuprofen (ADVIL) 800 MG tablet; Take 1 tablet (800 mg total) by mouth every 8 (eight) hours as needed.  Dispense: 30 tablet; Refill: 5  3. Vaginal discharge Rx: - Cervicovaginal ancillary only  4. Candida vaginitis Rx: - fluconazole (DIFLUCAN) 150 MG tablet; Take 1 tablet (150 mg total) by mouth once for 1 dose.  Dispense: 1 tablet; Refill: 2  5. IUD (intrauterine device) in place - doing well  6. Superficial fungus infection of skin Rx: - clotrimazole-betamethasone (LOTRISONE) cream; Apply 1 application topically 2 (two) times daily.  Dispense: 30 g; Refill: 2    Plan:    Education reviewed: calcium supplements, depression evaluation, low fat, low cholesterol diet, safe sex/STD prevention, self breast exams, and weight bearing exercise. Contraception: IUD. Follow up in: 1 year.     , MD 11/15/2020 3:46 PM

## 2020-11-16 LAB — CERVICOVAGINAL ANCILLARY ONLY
Chlamydia: NEGATIVE
Comment: NEGATIVE
Comment: NORMAL
Neisseria Gonorrhea: NEGATIVE

## 2020-12-22 DIAGNOSIS — B379 Candidiasis, unspecified: Secondary | ICD-10-CM | POA: Diagnosis not present

## 2021-03-09 ENCOUNTER — Ambulatory Visit: Payer: Medicaid Other | Admitting: Obstetrics

## 2021-03-17 DIAGNOSIS — R059 Cough, unspecified: Secondary | ICD-10-CM | POA: Diagnosis not present

## 2021-03-17 DIAGNOSIS — J111 Influenza due to unidentified influenza virus with other respiratory manifestations: Secondary | ICD-10-CM | POA: Diagnosis not present

## 2021-03-17 DIAGNOSIS — Z9189 Other specified personal risk factors, not elsewhere classified: Secondary | ICD-10-CM | POA: Diagnosis not present

## 2021-07-07 DIAGNOSIS — R040 Epistaxis: Secondary | ICD-10-CM | POA: Diagnosis not present

## 2021-07-07 DIAGNOSIS — J309 Allergic rhinitis, unspecified: Secondary | ICD-10-CM | POA: Diagnosis not present

## 2021-09-06 ENCOUNTER — Encounter: Payer: Self-pay | Admitting: *Deleted

## 2021-09-21 DIAGNOSIS — M25561 Pain in right knee: Secondary | ICD-10-CM | POA: Diagnosis not present

## 2021-09-27 ENCOUNTER — Telehealth: Payer: Self-pay

## 2021-11-15 ENCOUNTER — Telehealth: Payer: Self-pay | Admitting: *Deleted

## 2021-11-15 NOTE — Telephone Encounter (Signed)
Returned TC about IUD, vaginal spotting, and urinary pain. IUD placed 2019, due for removal in January per pt. Advised that vaginal spotting is not unusual with IUD. Advised urinary pain is not common with IUD and would more likely be UTI. Encouraged nurse visit for urinary symptoms and added report of continued vaginal discharge. Call transferred to schedulers.

## 2021-11-18 ENCOUNTER — Ambulatory Visit: Payer: Medicaid Other

## 2021-11-22 ENCOUNTER — Ambulatory Visit (INDEPENDENT_AMBULATORY_CARE_PROVIDER_SITE_OTHER): Payer: Medicaid Other

## 2021-11-22 ENCOUNTER — Other Ambulatory Visit (HOSPITAL_COMMUNITY)
Admission: RE | Admit: 2021-11-22 | Discharge: 2021-11-22 | Disposition: A | Discharge status: Home / Self Care | Payer: Medicaid Other | Source: Ambulatory Visit | Attending: Obstetrics and Gynecology | Admitting: Obstetrics and Gynecology

## 2021-11-22 VITALS — BP 112/74 | HR 51 | Wt 127.1 lb

## 2021-11-22 DIAGNOSIS — N898 Other specified noninflammatory disorders of vagina: Secondary | ICD-10-CM

## 2021-11-22 DIAGNOSIS — R300 Dysuria: Secondary | ICD-10-CM | POA: Diagnosis not present

## 2021-11-22 LAB — POCT URINALYSIS DIPSTICK
Bilirubin, UA: NEGATIVE
Blood, UA: POSITIVE
Glucose, UA: NEGATIVE
Ketones, UA: NEGATIVE
Nitrite, UA: NEGATIVE
Protein, UA: POSITIVE — AB
Spec Grav, UA: 1.025 (ref 1.010–1.025)
Urobilinogen, UA: 0.2 E.U./dL
pH, UA: 6 (ref 5.0–8.0)

## 2021-11-22 LAB — POCT URINE PREGNANCY: Preg Test, Ur: NEGATIVE

## 2021-11-22 MED ORDER — CEPHALEXIN 500 MG PO CAPS: 500.0000 mg | ORAL_CAPSULE | Freq: Two times a day (BID) | ORAL | 0 refills | Status: AC

## 2021-11-22 NOTE — Progress Notes (Signed)
SUBJECTIVE: Rachel Mcclure is a 26 y.o. female who complains of urinary frequency, urgency and dysuria x 7 days, with lower abdominal pain and pink discharge after intercourse.   OBJECTIVE: Appears well, in no apparent distress.  Vital signs are normal. Urine dipstick shows positive for RBC's, positive for protein, and positive for leukocytes.    ASSESSMENT: Dysuria  PLAN: Treatment per orders.  Call or return to clinic prn if these symptoms worsen or fail to improve as anticipated.

## 2021-11-22 NOTE — Patient Instructions (Signed)
Urinary Tract Infection, Adult  A urinary tract infection (UTI) is an infection of any part of the urinary tract. The urinary tract includes the kidneys, ureters, bladder, and urethra. These organs make, store, and get rid of urine in the body. An upper UTI affects the ureters and kidneys. A lower UTI affects the bladder and urethra. What are the causes? Most urinary tract infections are caused by bacteria in your genital area around your urethra, where urine leaves your body. These bacteria grow and cause inflammation of your urinary tract. What increases the risk? You are more likely to develop this condition if: You have a urinary catheter that stays in place. You are not able to control when you urinate or have a bowel movement (incontinence). You are female and you: Use a spermicide or diaphragm for birth control. Have low estrogen levels. Are pregnant. You have certain genes that increase your risk. You are sexually active. You take antibiotic medicines. You have a condition that causes your flow of urine to slow down, such as: An enlarged prostate, if you are female. Blockage in your urethra. A kidney stone. A nerve condition that affects your bladder control (neurogenic bladder). Not getting enough to drink, or not urinating often. You have certain medical conditions, such as: Diabetes. A weak disease-fighting system (immunesystem). Sickle cell disease. Gout. Spinal cord injury. What are the signs or symptoms? Symptoms of this condition include: Needing to urinate right away (urgency). Frequent urination. This may include small amounts of urine each time you urinate. Pain or burning with urination. Blood in the urine. Urine that smells bad or unusual. Trouble urinating. Cloudy urine. Vaginal discharge, if you are female. Pain in the abdomen or the lower back. You may also have: Vomiting or a decreased appetite. Confusion. Irritability or tiredness. A fever or  chills. Diarrhea. The first symptom in older adults may be confusion. In some cases, they may not have any symptoms until the infection has worsened. How is this diagnosed? This condition is diagnosed based on your medical history and a physical exam. You may also have other tests, including: Urine tests. Blood tests. Tests for STIs (sexually transmitted infections). If you have had more than one UTI, a cystoscopy or imaging studies may be done to determine the cause of the infections. How is this treated? Treatment for this condition includes: Antibiotic medicine. Over-the-counter medicines to treat discomfort. Drinking enough water to stay hydrated. If you have frequent infections or have other conditions such as a kidney stone, you may need to see a health care provider who specializes in the urinary tract (urologist). In rare cases, urinary tract infections can cause sepsis. Sepsis is a life-threatening condition that occurs when the body responds to an infection. Sepsis is treated in the hospital with IV antibiotics, fluids, and other medicines. Follow these instructions at home:  Medicines Take over-the-counter and prescription medicines only as told by your health care provider. If you were prescribed an antibiotic medicine, take it as told by your health care provider. Do not stop using the antibiotic even if you start to feel better. General instructions Make sure you: Empty your bladder often and completely. Do not hold urine for long periods of time. Empty your bladder after sex. Wipe from front to back after urinating or having a bowel movement if you are female. Use each tissue only one time when you wipe. Drink enough fluid to keep your urine pale yellow. Keep all follow-up visits. This is important. Contact a health   care provider if: Your symptoms do not get better after 1-2 days. Your symptoms go away and then return. Get help right away if: You have severe pain in  your back or your lower abdomen. You have a fever or chills. You have nausea or vomiting. Summary A urinary tract infection (UTI) is an infection of any part of the urinary tract, which includes the kidneys, ureters, bladder, and urethra. Most urinary tract infections are caused by bacteria in your genital area. Treatment for this condition often includes antibiotic medicines. If you were prescribed an antibiotic medicine, take it as told by your health care provider. Do not stop using the antibiotic even if you start to feel better. Keep all follow-up visits. This is important. This information is not intended to replace advice given to you by your health care provider. Make sure you discuss any questions you have with your health care provider. Document Revised: 10/31/2019 Document Reviewed: 10/31/2019 Elsevier Patient Education  2023 Elsevier Inc.  

## 2021-11-23 LAB — CERVICOVAGINAL ANCILLARY ONLY
Bacterial Vaginitis (gardnerella): POSITIVE — AB
Candida Glabrata: NEGATIVE
Candida Vaginitis: NEGATIVE
Chlamydia: NEGATIVE
Comment: NEGATIVE
Comment: NEGATIVE
Comment: NEGATIVE
Comment: NEGATIVE
Comment: NEGATIVE
Comment: NORMAL
Neisseria Gonorrhea: NEGATIVE
Trichomonas: NEGATIVE

## 2021-11-23 MED ORDER — METRONIDAZOLE 500 MG PO TABS: 500.0000 mg | ORAL_TABLET | Freq: Two times a day (BID) | ORAL | 0 refills | Status: DC

## 2021-11-23 NOTE — Progress Notes (Signed)
Patient was assessed and managed by nursing staff during this encounter. I have reviewed the chart and agree with the documentation and plan. I have also made any necessary editorial changes.  Scheryl Darter, MD 11/23/2021 10:53 PM

## 2021-11-23 NOTE — Addendum Note (Signed)
Addended by: Adam Phenix on: 11/23/2021 10:54 PM   Modules accepted: Orders

## 2021-11-24 LAB — URINE CULTURE: Organism ID, Bacteria: NO GROWTH

## 2021-11-28 ENCOUNTER — Other Ambulatory Visit: Payer: Self-pay

## 2021-11-28 DIAGNOSIS — N390 Urinary tract infection, site not specified: Secondary | ICD-10-CM

## 2021-11-29 ENCOUNTER — Ambulatory Visit: Payer: Medicaid Other

## 2021-11-30 LAB — URINE CULTURE

## 2021-12-08 DIAGNOSIS — R300 Dysuria: Secondary | ICD-10-CM | POA: Diagnosis not present

## 2021-12-08 DIAGNOSIS — N760 Acute vaginitis: Secondary | ICD-10-CM | POA: Diagnosis not present

## 2021-12-08 DIAGNOSIS — Z0130 Encounter for examination of blood pressure without abnormal findings: Secondary | ICD-10-CM | POA: Diagnosis not present

## 2021-12-08 DIAGNOSIS — N898 Other specified noninflammatory disorders of vagina: Secondary | ICD-10-CM | POA: Diagnosis not present

## 2021-12-08 DIAGNOSIS — Z3200 Encounter for pregnancy test, result unknown: Secondary | ICD-10-CM | POA: Diagnosis not present

## 2021-12-08 DIAGNOSIS — R1030 Lower abdominal pain, unspecified: Secondary | ICD-10-CM | POA: Diagnosis not present

## 2021-12-08 DIAGNOSIS — Z6823 Body mass index (BMI) 23.0-23.9, adult: Secondary | ICD-10-CM | POA: Diagnosis not present

## 2021-12-08 DIAGNOSIS — B9689 Other specified bacterial agents as the cause of diseases classified elsewhere: Secondary | ICD-10-CM | POA: Diagnosis not present

## 2022-01-05 ENCOUNTER — Other Ambulatory Visit (HOSPITAL_COMMUNITY)
Admission: RE | Admit: 2022-01-05 | Discharge: 2022-01-05 | Disposition: A | Discharge status: Home / Self Care | Payer: Medicaid Other | Source: Ambulatory Visit | Attending: Obstetrics and Gynecology | Admitting: Obstetrics and Gynecology

## 2022-01-05 ENCOUNTER — Ambulatory Visit: Payer: Medicaid Other | Admitting: Obstetrics and Gynecology

## 2022-01-05 ENCOUNTER — Encounter: Payer: Self-pay | Admitting: Obstetrics and Gynecology

## 2022-01-05 ENCOUNTER — Ambulatory Visit (INDEPENDENT_AMBULATORY_CARE_PROVIDER_SITE_OTHER): Payer: Medicaid Other | Admitting: Obstetrics and Gynecology

## 2022-01-05 VITALS — BP 103/71 | HR 64 | Ht 61.0 in | Wt 132.0 lb

## 2022-01-05 DIAGNOSIS — Z01419 Encounter for gynecological examination (general) (routine) without abnormal findings: Secondary | ICD-10-CM | POA: Insufficient documentation

## 2022-01-05 DIAGNOSIS — Z1329 Encounter for screening for other suspected endocrine disorder: Secondary | ICD-10-CM | POA: Diagnosis not present

## 2022-01-05 MED ORDER — IBUPROFEN 600 MG PO TABS: 600.0000 mg | ORAL_TABLET | Freq: Four times a day (QID) | ORAL | 3 refills | Status: DC | PRN

## 2022-01-05 NOTE — Progress Notes (Addendum)
26 y.o. GYN presents for AEX/PAP/IUD check.  C/o spotting and cramps 7/10.  Burning when she urinates.  Mirena IUD was inserted 04/19/2017.  Last PAP 04/10/2019

## 2022-01-05 NOTE — Progress Notes (Signed)
Subjective:     Rachel Mcclure is a 26 y.o. female P1 with BMI 24 who is here for a comprehensive physical exam. The patient reports no problems. Patient has amenorrhea with Mirena IUD although she reports some occasional vaginal spotting. She denies pelvic pain or abnormal discharge. She desires to check on the IUD. Patient desires to be tested for all STD and yeast/BV  Past Medical History:  Diagnosis Date   GERD (gastroesophageal reflux disease)    Migraines    Panic attacks    Past Surgical History:  Procedure Laterality Date   CESAREAN SECTION  11/23/2011   Procedure: CESAREAN SECTION;  Surgeon: Antionette Char, MD;  Location: [facility];  Service: Gynecology;  Laterality: N/A;  Primary Cesarean Section Delivery Boy @ 0240,  Apgars 6/8   Family History  Problem Relation Age of Onset   Hyperthyroidism Mother    Asthma Brother    Hypertension Maternal Grandmother    Hyperthyroidism Maternal Grandmother     Social History   Socioeconomic History   Marital status: Single    Spouse name: Not on file   Number of children: Not on file   Years of education: Not on file   Highest education level: Not on file  Occupational History   Not on file  Tobacco Use   Smoking status: Never   Smokeless tobacco: Never  Vaping Use   Vaping Use: Never used  Substance and Sexual Activity   Alcohol use: No   Drug use: No   Sexual activity: Yes    Partners: Male    Birth control/protection: I.U.D.  Other Topics Concern   Not on file  Social History Narrative   ** Merged History Encounter **       Right handed Lives in two story home with parents, boyfriend, child and brothers Working Programme researcher, broadcasting/film/video - high school diploma  Caffeine  - soda 12 oz / day  Exercise some    Social Determinants of Health   Financial Resource Strain: Not on file  Food Insecurity: Not on file  Transportation Needs: Not on file  Physical Activity: Not on file  Stress: Not on file  Social Connections: Not  on file  Intimate Partner Violence: Not on file   Health Maintenance  Topic Date Due   COVID-19 Vaccine (1) Never done   INFLUENZA VACCINE  11/01/2021   PAP-Cervical Cytology Screening  04/09/2022   PAP SMEAR-Modifier  04/09/2022   TETANUS/TDAP  05/06/2022   Hepatitis C Screening  Completed   HIV Screening  Completed   HPV VACCINES  Aged Out       Review of Systems Pertinent items noted in HPI and remainder of comprehensive ROS otherwise negative.   Objective:    GENERAL: Well-developed, well-nourished female in no acute distress.  HEENT: Normocephalic, atraumatic. Sclerae anicteric.  NECK: Supple. Normal thyroid.  LUNGS: Clear to auscultation bilaterally.  HEART: Regular rate and rhythm. BREASTS: Symmetric in size. No palpable masses or lymphadenopathy, skin changes, or nipple drainage. ABDOMEN: Soft, nontender, nondistended. No organomegaly. PELVIC: Normal external female genitalia. Vagina is pink and rugated.  Normal discharge. Normal appearing cervix with IUD strings visualized at the os. Uterus is normal in size. No adnexal mass or tenderness. Chaperone present during the pelvic exam EXTREMITIES: No cyanosis, clubbing, or edema, 2+ distal pulses.     Assessment:    Healthy female exam.      Plan:    Pap smear collected  STI screening per patient request Health maintenance  labs ordered Patient will be contacted with abnormal results Mirena IUD inserted in 04/2017 See After Visit Summary for Counseling Recommendations

## 2022-01-06 LAB — COMPREHENSIVE METABOLIC PANEL
ALT: 23 IU/L (ref 0–32)
AST: 19 IU/L (ref 0–40)
Albumin/Globulin Ratio: 2.1 (ref 1.2–2.2)
Albumin: 4.6 g/dL (ref 4.0–5.0)
Alkaline Phosphatase: 59 IU/L (ref 44–121)
BUN/Creatinine Ratio: 11 (ref 9–23)
BUN: 11 mg/dL (ref 6–20)
Bilirubin Total: 0.5 mg/dL (ref 0.0–1.2)
CO2: 27 mmol/L (ref 20–29)
Calcium: 9.2 mg/dL (ref 8.7–10.2)
Chloride: 102 mmol/L (ref 96–106)
Creatinine, Ser: 1.02 mg/dL — ABNORMAL HIGH (ref 0.57–1.00)
Globulin, Total: 2.2 g/dL (ref 1.5–4.5)
Glucose: 77 mg/dL (ref 70–99)
Potassium: 4.1 mmol/L (ref 3.5–5.2)
Sodium: 140 mmol/L (ref 134–144)
Total Protein: 6.8 g/dL (ref 6.0–8.5)
eGFR: 77 mL/min/{1.73_m2} (ref >59)

## 2022-01-06 LAB — CBC
Hematocrit: 40 % (ref 34.0–46.6)
Hemoglobin: 13.7 g/dL (ref 11.1–15.9)
MCH: 31.1 pg (ref 26.6–33.0)
MCHC: 34.3 g/dL (ref 31.5–35.7)
MCV: 91 fL (ref 79–97)
Platelets: 202 10*3/uL (ref 150–450)
RBC: 4.4 x10E6/uL (ref 3.77–5.28)
RDW: 12.7 % (ref 11.7–15.4)
WBC: 5.7 10*3/uL (ref 3.4–10.8)

## 2022-01-06 LAB — TSH: TSH: 1.03 u[IU]/mL (ref 0.450–4.500)

## 2022-01-06 LAB — LIPID PANEL
Chol/HDL Ratio: 3.5 ratio (ref 0.0–4.4)
Cholesterol, Total: 145 mg/dL (ref 100–199)
HDL: 42 mg/dL (ref >39)
LDL Chol Calc (NIH): 81 mg/dL (ref 0–99)
Triglycerides: 125 mg/dL (ref 0–149)
VLDL Cholesterol Cal: 22 mg/dL (ref 5–40)

## 2022-01-06 LAB — HEMOGLOBIN A1C
Est. average glucose Bld gHb Est-mCnc: 105 mg/dL
Hgb A1c MFr Bld: 5.3 % (ref 4.8–5.6)

## 2022-01-06 LAB — CERVICOVAGINAL ANCILLARY ONLY
Bacterial Vaginitis (gardnerella): POSITIVE — AB
Candida Glabrata: NEGATIVE
Candida Vaginitis: POSITIVE — AB
Chlamydia: NEGATIVE
Comment: NEGATIVE
Comment: NEGATIVE
Comment: NEGATIVE
Comment: NEGATIVE
Comment: NORMAL
Neisseria Gonorrhea: NEGATIVE

## 2022-01-06 LAB — RPR: RPR Ser Ql: NONREACTIVE

## 2022-01-06 LAB — HIV ANTIBODY (ROUTINE TESTING W REFLEX): HIV Screen 4th Generation wRfx: NONREACTIVE

## 2022-01-06 LAB — HEPATITIS B SURFACE ANTIGEN: Hepatitis B Surface Ag: NEGATIVE

## 2022-01-06 LAB — HEPATITIS C ANTIBODY: Hep C Virus Ab: NONREACTIVE

## 2022-01-07 LAB — URINE CULTURE

## 2022-01-09 MED ORDER — FLUCONAZOLE 150 MG PO TABS: 150.0000 mg | ORAL_TABLET | Freq: Once | ORAL | 0 refills | Status: AC

## 2022-01-09 MED ORDER — METRONIDAZOLE 500 MG PO TABS: 500.0000 mg | ORAL_TABLET | Freq: Two times a day (BID) | ORAL | 0 refills | Status: DC

## 2022-01-09 NOTE — Addendum Note (Signed)
Addended by: Mora Bellman on: 01/09/2022 08:36 AM   Modules accepted: Orders

## 2022-01-10 LAB — CYTOLOGY - PAP
Adequacy: ABSENT
Diagnosis: NEGATIVE

## 2022-06-29 DIAGNOSIS — M79644 Pain in right finger(s): Secondary | ICD-10-CM | POA: Diagnosis not present

## 2022-08-27 DIAGNOSIS — R519 Headache, unspecified: Secondary | ICD-10-CM | POA: Diagnosis not present

## 2022-08-27 DIAGNOSIS — F410 Panic disorder [episodic paroxysmal anxiety] without agoraphobia: Secondary | ICD-10-CM | POA: Diagnosis not present

## 2022-09-01 DIAGNOSIS — G43909 Migraine, unspecified, not intractable, without status migrainosus: Secondary | ICD-10-CM | POA: Diagnosis not present

## 2022-09-01 DIAGNOSIS — M25541 Pain in joints of right hand: Secondary | ICD-10-CM | POA: Diagnosis not present

## 2022-09-01 DIAGNOSIS — Z6823 Body mass index (BMI) 23.0-23.9, adult: Secondary | ICD-10-CM | POA: Diagnosis not present

## 2022-09-01 DIAGNOSIS — Z0000 Encounter for general adult medical examination without abnormal findings: Secondary | ICD-10-CM | POA: Diagnosis not present

## 2022-09-26 DIAGNOSIS — R1031 Right lower quadrant pain: Secondary | ICD-10-CM | POA: Diagnosis not present

## 2022-10-22 DIAGNOSIS — H6121 Impacted cerumen, right ear: Secondary | ICD-10-CM | POA: Diagnosis not present

## 2022-11-27 DIAGNOSIS — J209 Acute bronchitis, unspecified: Secondary | ICD-10-CM | POA: Diagnosis not present

## 2022-11-27 DIAGNOSIS — F411 Generalized anxiety disorder: Secondary | ICD-10-CM | POA: Diagnosis not present

## 2022-11-27 DIAGNOSIS — G43909 Migraine, unspecified, not intractable, without status migrainosus: Secondary | ICD-10-CM | POA: Diagnosis not present

## 2022-11-27 DIAGNOSIS — R059 Cough, unspecified: Secondary | ICD-10-CM | POA: Diagnosis not present

## 2023-01-02 DIAGNOSIS — R1084 Generalized abdominal pain: Secondary | ICD-10-CM | POA: Diagnosis not present

## 2023-02-27 ENCOUNTER — Emergency Department (HOSPITAL_BASED_OUTPATIENT_CLINIC_OR_DEPARTMENT_OTHER)
Admission: EM | Admit: 2023-02-27 | Discharge: 2023-02-27 | Disposition: A | Discharge status: Home / Self Care | Payer: Medicaid Other | Attending: Emergency Medicine | Admitting: Emergency Medicine

## 2023-02-27 ENCOUNTER — Other Ambulatory Visit: Payer: Self-pay

## 2023-02-27 ENCOUNTER — Encounter (HOSPITAL_BASED_OUTPATIENT_CLINIC_OR_DEPARTMENT_OTHER): Payer: Self-pay

## 2023-02-27 DIAGNOSIS — R0981 Nasal congestion: Secondary | ICD-10-CM | POA: Diagnosis present

## 2023-02-27 DIAGNOSIS — B9789 Other viral agents as the cause of diseases classified elsewhere: Secondary | ICD-10-CM | POA: Diagnosis not present

## 2023-02-27 DIAGNOSIS — J069 Acute upper respiratory infection, unspecified: Secondary | ICD-10-CM | POA: Diagnosis not present

## 2023-02-27 DIAGNOSIS — R040 Epistaxis: Secondary | ICD-10-CM | POA: Insufficient documentation

## 2023-02-27 LAB — GROUP A STREP BY PCR: Group A Strep by PCR: NOT DETECTED

## 2023-02-27 MED ORDER — DEXAMETHASONE 4 MG PO TABS
10.0000 mg | ORAL_TABLET | Freq: Once | ORAL | Status: AC
  Administered 2023-02-27: 10 mg via ORAL
  Filled 2023-02-27: qty 3

## 2023-02-27 NOTE — Discharge Instructions (Addendum)
Drink plenty of fluids.  You may use over-the-counter cough and cold medications as needed.  Return if you have new or concerning symptoms.

## 2023-02-27 NOTE — ED Provider Notes (Signed)
[facility] AT [facility] Provider Note   CSN: [id number] Arrival date & time: 02/27/23  0232     History  Chief Complaint  Patient presents with   Epistaxis    Rachel Mcclure is a 27 y.o. female.  The history is provided by the patient.  Epistaxis She has no significant past history, and comes in complaining of nasal congestion and sore throat for the last week.  She has had green nasal mucus.  She denies fever, chills, sweats.  She has not been coughing.  Yesterday, she had a nosebleed which was on the left side and stopped spontaneously.  She feels that she is losing her voice.   Home Medications Prior to Admission medications   Medication Sig Start Date End Date Taking? Authorizing Provider  clotrimazole-betamethasone (LOTRISONE) cream Apply 1 application topically 2 (two) times daily. Patient not taking: Reported on 11/22/2021 11/15/20   Brock Bad, MD  ibuprofen (ADVIL) 600 MG tablet Take 1 tablet (600 mg total) by mouth every 6 (six) hours as needed. 01/05/22   Constant, Peggy, MD  ibuprofen (ADVIL) 800 MG tablet Take 1 tablet (800 mg total) by mouth every 8 (eight) hours as needed. 11/15/20   Brock Bad, MD  levonorgestrel (MIRENA) 20 MCG/24HR IUD 1 each by Intrauterine route once. 04/24/2012 Patient not taking: Reported on 11/22/2021    [provider]  metroNIDAZOLE (FLAGYL) 500 MG tablet Take 1 tablet (500 mg total) by mouth 2 (two) times daily. 11/23/21   Adam Phenix, MD  metroNIDAZOLE (FLAGYL) 500 MG tablet Take 1 tablet (500 mg total) by mouth 2 (two) times daily. 01/09/22   Constant, Peggy, MD  PARoxetine (PAXIL) 20 MG tablet Take 1 tablet (20 mg total) by mouth daily. Patient not taking: Reported on 08/05/2019 04/10/19   Brock Bad, MD  phentermine 37.5 MG capsule Take 1 capsule (37.5 mg total) by mouth every morning. Patient not taking: Reported on 11/15/2020 05/18/20   Brock Bad, MD      Allergies     Topamax [topiramate]    Review of Systems   Review of Systems  HENT:  Positive for nosebleeds.   All other systems reviewed and are negative.   Physical Exam Updated Vital Signs BP 104/89   Pulse 70   Temp 97.7 F (36.5 C)   Resp 20   Ht 5' (1.524 m)   Wt 59 kg   SpO2 99%   BMI 25.39 kg/m  Physical Exam Vitals and nursing note reviewed.   27 year old female, resting comfortably and in no acute distress. Vital signs are normal. Oxygen saturation is 99%, which is normal. Head is normocephalic and atraumatic. PERRLA, EOMI. Oropharynx is mildly erythematous without exudate.  Nasal mucosa is mildly hyperemic but no obvious bleeding site is seen. Neck is nontender and supple without adenopathy. Lungs are clear without rales, wheezes, or rhonchi. Chest is nontender. Heart has regular rate and rhythm without murmur. Abdomen is soft, flat, nontender.  ED Results / Procedures / Treatments   Labs (all labs ordered are listed, but only abnormal results are displayed) Labs Reviewed  GROUP A STREP BY PCR    Procedures Procedures    Medications Ordered in ED Medications  dexamethasone (DECADRON) tablet 10 mg (has no administration in time range)    ED Course/ Medical Decision Making/ A&P  Medical Decision Making Risk Prescription drug management.   Upper respiratory infection with sore throat nasal congestion, transient episode of epistaxis yesterday.  This is most likely of viral etiology.  I have ordered a strep PCR test to rule out streptococcal infection.  Strep PCR is negative, no evidence of streptococcal disease.  I have advised patient of this finding, I have ordered a dose of dexamethasone.  I have encouraged her to use over-the-counter cough and cold medication and to drink plenty of fluids.  Return if she has new or concerning symptoms.  Final Clinical Impression(s) / ED Diagnoses Final diagnoses:  Viral upper respiratory  tract infection  Epistaxis    Rx / DC Orders ED Discharge Orders     None         Dione Booze, MD 02/27/23 [phone]

## 2023-02-27 NOTE — ED Triage Notes (Signed)
Pt states that she had nosebleeds yesterday and today and now has a sore throat from the drainage. No current bleeding.

## 2023-02-28 DIAGNOSIS — R040 Epistaxis: Secondary | ICD-10-CM | POA: Diagnosis not present

## 2023-02-28 DIAGNOSIS — J018 Other acute sinusitis: Secondary | ICD-10-CM | POA: Diagnosis not present

## 2023-02-28 DIAGNOSIS — J309 Allergic rhinitis, unspecified: Secondary | ICD-10-CM | POA: Diagnosis not present

## 2023-02-28 DIAGNOSIS — R6883 Chills (without fever): Secondary | ICD-10-CM | POA: Diagnosis not present

## 2023-04-26 DIAGNOSIS — Z0000 Encounter for general adult medical examination without abnormal findings: Secondary | ICD-10-CM | POA: Diagnosis not present

## 2023-04-26 DIAGNOSIS — L709 Acne, unspecified: Secondary | ICD-10-CM | POA: Diagnosis not present

## 2023-04-26 DIAGNOSIS — M542 Cervicalgia: Secondary | ICD-10-CM | POA: Diagnosis not present

## 2023-08-14 DIAGNOSIS — R0602 Shortness of breath: Secondary | ICD-10-CM | POA: Diagnosis not present

## 2023-08-14 DIAGNOSIS — Z9109 Other allergy status, other than to drugs and biological substances: Secondary | ICD-10-CM | POA: Diagnosis not present

## 2023-08-16 ENCOUNTER — Ambulatory Visit: Admitting: Obstetrics

## 2023-08-16 ENCOUNTER — Other Ambulatory Visit (HOSPITAL_COMMUNITY)
Admission: RE | Admit: 2023-08-16 | Discharge: 2023-08-16 | Disposition: A | Discharge status: Home / Self Care | Source: Ambulatory Visit | Attending: Obstetrics | Admitting: Obstetrics

## 2023-08-16 ENCOUNTER — Encounter: Payer: Self-pay | Admitting: Obstetrics

## 2023-08-16 VITALS — BP 118/80 | HR 62 | Ht 61.0 in | Wt 133.8 lb

## 2023-08-16 DIAGNOSIS — N898 Other specified noninflammatory disorders of vagina: Secondary | ICD-10-CM | POA: Insufficient documentation

## 2023-08-16 DIAGNOSIS — N12 Tubulo-interstitial nephritis, not specified as acute or chronic: Secondary | ICD-10-CM | POA: Diagnosis not present

## 2023-08-16 DIAGNOSIS — R102 Pelvic and perineal pain: Secondary | ICD-10-CM | POA: Diagnosis not present

## 2023-08-16 DIAGNOSIS — R300 Dysuria: Secondary | ICD-10-CM | POA: Diagnosis not present

## 2023-08-16 DIAGNOSIS — Z01419 Encounter for gynecological examination (general) (routine) without abnormal findings: Secondary | ICD-10-CM | POA: Insufficient documentation

## 2023-08-16 DIAGNOSIS — K5901 Slow transit constipation: Secondary | ICD-10-CM

## 2023-08-16 DIAGNOSIS — B369 Superficial mycosis, unspecified: Secondary | ICD-10-CM | POA: Diagnosis not present

## 2023-08-16 DIAGNOSIS — N946 Dysmenorrhea, unspecified: Secondary | ICD-10-CM

## 2023-08-16 LAB — POCT URINALYSIS DIPSTICK
Blood, UA: NEGATIVE
Glucose, UA: NEGATIVE
Ketones, UA: NEGATIVE
Nitrite, UA: NEGATIVE
Protein, UA: POSITIVE — AB
Spec Grav, UA: 1.025 (ref 1.010–1.025)
Urobilinogen, UA: 2 U/dL — AB
pH, UA: 6 (ref 5.0–8.0)

## 2023-08-16 MED ORDER — IBUPROFEN 800 MG PO TABS: 800.0000 mg | ORAL_TABLET | Freq: Three times a day (TID) | ORAL | 5 refills | Status: AC | PRN

## 2023-08-16 MED ORDER — CEFUROXIME AXETIL 500 MG PO TABS: 500.0000 mg | ORAL_TABLET | Freq: Two times a day (BID) | ORAL | 0 refills | Status: AC

## 2023-08-16 MED ORDER — POLYETHYLENE GLYCOL 3350 17 G PO PACK: 17.0000 g | PACK | Freq: Every day | ORAL | 11 refills | Status: AC

## 2023-08-16 MED ORDER — FLUCONAZOLE 150 MG PO TABS: 150.0000 mg | ORAL_TABLET | Freq: Once | ORAL | 1 refills | Status: AC

## 2023-08-16 MED ORDER — DOCUSATE SODIUM 100 MG PO CAPS: 100.0000 mg | ORAL_CAPSULE | Freq: Two times a day (BID) | ORAL | 11 refills | Status: AC

## 2023-08-16 MED ORDER — KETOCONAZOLE 2 % EX CREA: TOPICAL_CREAM | Freq: Every day | CUTANEOUS | 1 refills | Status: AC

## 2023-08-16 MED ORDER — PHENAZOPYRIDINE HCL 200 MG PO TABS: 200.0000 mg | ORAL_TABLET | Freq: Three times a day (TID) | ORAL | 0 refills | Status: AC | PRN

## 2023-08-16 NOTE — Progress Notes (Unsigned)
 Pt presents for annual. Pt complains of burning and pain while urinating, being tired and low sex drive.

## 2023-08-16 NOTE — Progress Notes (Unsigned)
 Subjective:        Rachel Mcclure is a 28 y.o. female here for a routine exam.  Current complaints: Vaginal discharge.  Burning with urination, and back pain ( right flank ) with urination.   Denies fever/chills.  Also has RLQ pelvic pain       Personal health questionnaire:  Is patient Ashkenazi Jewish, have a family history of breast and/or ovarian cancer: no Is there a family history of uterine cancer diagnosed at age < 4, gastrointestinal cancer, urinary tract cancer, family member who is a Personnel officer syndrome-associated carrier: no Is the patient overweight and hypertensive, family history of diabetes, personal history of gestational diabetes, preeclampsia or PCOS: no Is patient over 7, have PCOS,  family history of premature CHD under age 22, diabetes, smoke, have hypertension or peripheral artery disease:  no At any time, has a partner hit, kicked or otherwise hurt or frightened you?: no Over the past 2 weeks, have you felt down, depressed or hopeless?: no Over the past 2 weeks, have you felt little interest or pleasure in doing things?:no   Gynecologic History Patient's last menstrual period was 07/26/2023 (exact date). Contraception: IUD Last Pap: 01-05-2022. Results were: normal Last mammogram: n/a. Results were: n/a  Obstetric History OB History  Gravida Para Term Preterm AB Living  1 1 1  0 0 1  SAB IAB Ectopic Multiple Live Births  0 0 0  1    # Outcome Date GA Lbr Len/2nd Weight Sex Type Anes PTL Lv  1 Term 11/23/11 [redacted]w[redacted]d 19:15 / 04:25  M CS-LTranv EPI      Past Medical History:  Diagnosis Date   GERD (gastroesophageal reflux disease)    Migraines    Panic attacks     Past Surgical History:  Procedure Laterality Date   CESAREAN SECTION  11/23/2011   Procedure: CESAREAN SECTION;  Surgeon: Abdul Hodgkin, MD;  Location: [facility];  Service: Gynecology;  Laterality: N/A;  Primary Cesarean Section Delivery Boy @ 0240,  Apgars 6/8     Current Outpatient  Medications:    cefUROXime (CEFTIN) 500 MG tablet, Take 1 tablet (500 mg total) by mouth 2 (two) times daily with a meal., Disp: 28 tablet, Rfl: 0   docusate sodium  (COLACE) 100 MG capsule, Take 1 capsule (100 mg total) by mouth 2 (two) times daily., Disp: 30 capsule, Rfl: 11   ketoconazole (NIZORAL) 2 % cream, Apply topically daily., Disp: 60 g, Rfl: 1   phenazopyridine (PYRIDIUM) 200 MG tablet, Take 1 tablet (200 mg total) by mouth 3 (three) times daily as needed for pain., Disp: 10 tablet, Rfl: 0   polyethylene glycol (MIRALAX ) 17 g packet, Take 17 g by mouth at bedtime., Disp: 14 each, Rfl: 11   ibuprofen  (ADVIL ) 800 MG tablet, Take 1 tablet (800 mg total) by mouth every 8 (eight) hours as needed., Disp: 30 tablet, Rfl: 5 Allergies  Allergen Reactions   Topamax  [Topiramate ]     Social History   Tobacco Use   Smoking status: Never   Smokeless tobacco: Never  Substance Use Topics   Alcohol use: No    Family History  Problem Relation Age of Onset   Hyperthyroidism Mother    Asthma Brother    Hypertension Maternal Grandmother    Hyperthyroidism Maternal Grandmother       Review of Systems  Constitutional: negative for fatigue and weight loss Respiratory: negative for cough and wheezing Cardiovascular: negative for chest pain, fatigue and palpitations Gastrointestinal:  positive  for abdominal pain and constipation Musculoskeletal: positive for myalgias Neurological: negative for gait problems and tremors Behavioral/Psych: negative for abusive relationship, depression Endocrine: negative for temperature intolerance    Genitourinary: positive for vaginal discharge, pelvic pain, dysuria and back pain.- right flank.  negative for abnormal menstrual periods, genital lesions, hot flashes, sexual problems  Integument/breast: negative for breast lump, breast tenderness, nipple discharge and skin lesion(s)    Objective:       BP 118/80   Pulse 62   Ht 5\' 1"  (1.549 m)   Wt 133 lb  12.8 oz (60.7 kg)   LMP 07/26/2023 (Exact Date)   BMI 25.28 kg/m  General:   Alert and no distress  Skin:   no rash or abnormalities  Lungs:   clear to auscultation bilaterally  Heart:   regular rate and rhythm, S1, S2 normal, no murmur, click, rub or gallop  Breasts:   normal without suspicious masses, skin or nipple changes or axillary nodes  Abdomen:  normal findings: no organomegaly, soft, non-tender and no hernia  Pelvis:  External genitalia: normal general appearance Urinary system: urethral meatus normal and bladder without fullness, nontender Vaginal: normal without tenderness, induration or masses Cervix: normal appearance Adnexa: normal bimanual exam Uterus: anteverted and non-tender, normal size   Lab Review Urine pregnancy test Labs reviewed yes Radiologic studies reviewed no  I have spent a total of 20 minutes of face-to-face time, excluding clinical staff time, reviewing notes and preparing to see patient, ordering tests and/or medications, and counseling the patient.   Assessment:    1. Encounter for gynecological examination without abnormal finding (Primary) Rx: - Cytology - PAP( [place])  2. Dysmenorrhea Rx: - ibuprofen  (ADVIL ) 800 MG tablet; Take 1 tablet (800 mg total) by mouth every 8 (eight) hours as needed.  Dispense: 30 tablet; Refill: 5  3. Vaginal discharge Rx: - Cervicovaginal ancillary only( [place]) - fluconazole  (DIFLUCAN ) 150 MG tablet; Take 1 tablet (150 mg total) by mouth once for 1 dose. May repeat in 7 days if no relief.  Dispense: 1 tablet; Refill: 1  4. Pelvic pain Rx: - US  PELVIC COMPLETE WITH TRANSVAGINAL; Future  5. Dysuria Rx: - POCT Urinalysis Dipstick - cefUROXime (CEFTIN) 500 MG tablet; Take 1 tablet (500 mg total) by mouth 2 (two) times daily with a meal.  Dispense: 28 tablet; Refill: 0  6. Pyelonephritis Rx: - Urine Culture - phenazopyridine (PYRIDIUM) 200 MG tablet; Take 1 tablet (200 mg total) by mouth 3  (three) times daily as needed for pain.  Dispense: 10 tablet; Refill: 0  7. Superficial fungus infection of skin Rx: - ketoconazole (NIZORAL) 2 % cream; Apply topically daily.  Dispense: 60 g; Refill: 1  8. Constipation by delayed colonic transit Rx: - polyethylene glycol (MIRALAX ) 17 g packet; Take 17 g by mouth at bedtime.  Dispense: 14 each; Refill: 11 - docusate sodium  (COLACE) 100 MG capsule; Take 1 capsule (100 mg total) by mouth 2 (two) times daily.  Dispense: 30 capsule; Refill: 11     Plan:    Education reviewed: calcium supplements, depression evaluation, low fat, low cholesterol diet, safe sex/STD prevention, self breast exams, and weight bearing exercise. Contraception: IUD. Follow up in: 4 weeks. Or sooner if fever and worsening pain develops  Meds ordered this encounter  Medications   phenazopyridine (PYRIDIUM) 200 MG tablet    Sig: Take 1 tablet (200 mg total) by mouth 3 (three) times daily as needed for pain.    Dispense:  10 tablet    Refill:  0   cefUROXime (CEFTIN) 500 MG tablet    Sig: Take 1 tablet (500 mg total) by mouth 2 (two) times daily with a meal.    Dispense:  28 tablet    Refill:  0   ibuprofen  (ADVIL ) 800 MG tablet    Sig: Take 1 tablet (800 mg total) by mouth every 8 (eight) hours as needed.    Dispense:  30 tablet    Refill:  5   fluconazole  (DIFLUCAN ) 150 MG tablet    Sig: Take 1 tablet (150 mg total) by mouth once for 1 dose. May repeat in 7 days if no relief.    Dispense:  1 tablet    Refill:  1   ketoconazole (NIZORAL) 2 % cream    Sig: Apply topically daily.    Dispense:  60 g    Refill:  1   polyethylene glycol (MIRALAX ) 17 g packet    Sig: Take 17 g by mouth at bedtime.    Dispense:  14 each    Refill:  11   docusate sodium  (COLACE) 100 MG capsule    Sig: Take 1 capsule (100 mg total) by mouth 2 (two) times daily.    Dispense:  30 capsule    Refill:  11   Orders Placed This Encounter  Procedures   Urine Culture   US  PELVIC  COMPLETE WITH TRANSVAGINAL    Standing Status:   Future    Expiration Date:   08/15/2024    Reason for Exam (SYMPTOM  OR DIAGNOSIS REQUIRED):   Pelvic pain - Right Lower Quadrant    Preferred imaging location?:   [facility] Ul[facility]ound   POCT Urinalysis Dipstick    Gabrielle Joiner, MD, FACOG Attending Obstetrician & Gynecologist, [facility], [facility], [place] 08/16/2023

## 2023-08-18 LAB — URINE CULTURE

## 2023-08-20 ENCOUNTER — Other Ambulatory Visit: Payer: Self-pay

## 2023-08-20 LAB — CERVICOVAGINAL ANCILLARY ONLY
Bacterial Vaginitis (gardnerella): POSITIVE — AB
Candida Glabrata: NEGATIVE
Candida Vaginitis: POSITIVE — AB
Chlamydia: NEGATIVE
Comment: NEGATIVE
Comment: NEGATIVE
Comment: NEGATIVE
Comment: NEGATIVE
Comment: NEGATIVE
Comment: NORMAL
Neisseria Gonorrhea: NEGATIVE
Trichomonas: NEGATIVE

## 2023-08-20 MED ORDER — FLUCONAZOLE 150 MG PO TABS: 150.0000 mg | ORAL_TABLET | Freq: Once | ORAL | 0 refills | Status: AC

## 2023-08-20 MED ORDER — METRONIDAZOLE 500 MG PO TABS: 500.0000 mg | ORAL_TABLET | Freq: Two times a day (BID) | ORAL | 0 refills | Status: DC

## 2023-08-21 ENCOUNTER — Ambulatory Visit: Payer: Self-pay | Admitting: Family Medicine

## 2023-08-22 LAB — CYTOLOGY - PAP
Comment: NEGATIVE
Diagnosis: NEGATIVE
High risk HPV: NEGATIVE

## 2023-08-23 ENCOUNTER — Ambulatory Visit (HOSPITAL_COMMUNITY)
Admission: RE | Admit: 2023-08-23 | Discharge: 2023-08-23 | Disposition: A | Discharge status: Home / Self Care | Source: Ambulatory Visit | Attending: Obstetrics | Admitting: Obstetrics

## 2023-08-23 ENCOUNTER — Inpatient Hospital Stay (HOSPITAL_COMMUNITY)
Admission: AD | Admit: 2023-08-23 | Discharge: 2023-08-23 | Disposition: A | Discharge status: Home / Self Care | Attending: Obstetrics and Gynecology | Admitting: Obstetrics and Gynecology

## 2023-08-23 DIAGNOSIS — R102 Pelvic and perineal pain: Secondary | ICD-10-CM | POA: Diagnosis not present

## 2023-08-23 NOTE — MAU Note (Signed)
 Pt on our MAU board in error.

## 2023-09-10 ENCOUNTER — Telehealth: Admitting: Obstetrics

## 2023-09-10 ENCOUNTER — Encounter: Payer: Self-pay | Admitting: Obstetrics

## 2023-09-10 DIAGNOSIS — N898 Other specified noninflammatory disorders of vagina: Secondary | ICD-10-CM

## 2023-09-10 DIAGNOSIS — R339 Retention of urine, unspecified: Secondary | ICD-10-CM

## 2023-09-10 DIAGNOSIS — N3289 Other specified disorders of bladder: Secondary | ICD-10-CM

## 2023-09-10 MED ORDER — FLUCONAZOLE 200 MG PO TABS: 200.0000 mg | ORAL_TABLET | ORAL | 2 refills | Status: DC

## 2023-09-10 NOTE — Progress Notes (Signed)
 TELEHEALTH GYNECOLOGY VISIT ENCOUNTER NOTE  Provider location: Center for [facility] at [facility]   Patient location: Home  I connected with Rachel Mcclure on 09/10/23 at 10:45 AM EDT by telephone and verified that I am speaking with the correct person using two identifiers. Patient was unable to do MyChart audiovisual encounter due to technical difficulties, she tried several times.    I discussed the limitations, risks, security and privacy concerns of performing an evaluation and management service by telephone and the availability of in person appointments. I also discussed with the patient that there may be a patient responsible charge related to this service. The patient expressed understanding and agreed to proceed.   History:  Rachel Mcclure is a 28 y.o. [id number] female being evaluated today for ultrasound results for pelvic pain. She denies any abnormal vaginal bleeding,  or other concerns.       Past Medical History:  Diagnosis Date   GERD (gastroesophageal reflux disease)    Migraines    Panic attacks    Past Surgical History:  Procedure Laterality Date   CESAREAN SECTION  11/23/2011   Procedure: CESAREAN SECTION;  Surgeon: Abdul Hodgkin, MD;  Location: [facility];  Service: Gynecology;  Laterality: N/A;  Primary Cesarean Section Delivery Boy @ [phone],  Apgars 6/8   The following portions of the patient's history were reviewed and updated as appropriate: allergies, current medications, past family history, past medical history, past social history, past surgical history and problem list.   Health Maintenance:  Normal pap and negative HRHPV on 08/16/2023.    Review of Systems:  Pertinent items noted in HPI and remainder of comprehensive ROS otherwise negative.  Physical Exam:   General:  Alert, oriented and cooperative.   Mental Status: Normal mood and affect perceived. Normal judgment and thought content.  Physical exam deferred due to nature of the  encounter  Labs and Imaging No results found for this or any previous visit (from the past 2 weeks). US  PELVIC COMPLETE WITH TRANSVAGINAL Result Date: 09/02/2023 CLINICAL DATA:  Pelvic pain - Right Lower Quadrant EXAM: TRANSABDOMINAL AND TRANSVAGINAL ULTRASOUND OF PELVIS TECHNIQUE: Both transabdominal and transvaginal ultrasound examinations of the pelvis were performed. Transabdominal technique was performed for global imaging of the pelvis including uterus, ovaries, adnexal regions, and pelvic cul-de-sac. It was necessary to proceed with endovaginal exam following the transabdominal exam to visualize the uterus and adnexae in better detail. COMPARISON:  None Available. FINDINGS: Uterus Measurements: 7.6 x 2.6 x 4.0 cm = volume: 41.5 mL. No fibroids or other mass visualized. Anteverted position. Endometrium Thickness: 2 mm. No focal abnormality visualized. Linear echogenic IUD appropriately position within the endometrial cavity. Right ovary Measurements: 2.3 x 1.7 x 1.8 cm = volume: 3.5 mL. Normal appearance/no adnexal mass. Left ovary Measurements: 2.8 x 1.0 x 2.1 cm = volume: 3.1 mL. Normal appearance/no adnexal mass. Other findings No abnormal free fluid. IMPRESSION: 1. Normal pelvic ultrasound. 2. IUD appropriately position within the endometrial cavity. Electronically Signed   By: Melven Stable.  Shick M.D.   On: 09/02/2023 21:18      Assessment and Plan:     1. Urinary retention with incomplete bladder emptying (Primary) Rx: - Ambulatory referral to Urogynecology  2. Bladder spasms Rx: - Ambulatory referral to Urogynecology  3. Vaginal discharge Rx: - fluconazole  (DIFLUCAN ) 200 MG tablet; Take 1 tablet (200 mg total) by mouth every 3 (three) days.  Dispense: 3 tablet; Refill: 2    Plan:   Follow up prn  I discussed the assessment and treatment plan with the patient. The patient was provided an opportunity to ask questions and all were answered. The patient agreed with the plan and demonstrated an  understanding of the instructions.   The patient was advised to call back or seek an in-person evaluation/go to the ED if the symptoms worsen or if the condition fails to improve as anticipated.  I have spent a total of 10 minutes non-face-to-face time, excluding clinical staff time, reviewing notes and preparing to see patient, ordering tests and/or medications, and counseling the patient.    Elana Grayer, MD Center for [facility], [facility], [place] 09/10/2023

## 2023-10-08 ENCOUNTER — Other Ambulatory Visit: Payer: Self-pay

## 2023-10-08 DIAGNOSIS — R102 Pelvic and perineal pain: Secondary | ICD-10-CM

## 2023-10-09 NOTE — Progress Notes (Unsigned)
 New Patient Evaluation and Consultation  Referring Provider: Rudy Carlin LABOR, MD PCP: Joshua Domino, DO (Inactive) Date of Service: 10/10/2023  SUBJECTIVE Chief Complaint: No chief complaint on file.  History of Present Illness: Rachel Mcclure is a 28 y.o. Hispanic female seen in consultation at the request of Dr Rudy for evaluation of RLQ pelvic pain, incomplete bladder emptying, lower back pain, and bladder spasms.    Dysuria Pending referral to pelvic floor PT   Started miralax  and colace for constipation   Urine culture 08/16/23 with mixed urogenital flora, UA + leuk/protein. Nuswab + BV and candida   ***Review of records significant for: ***history of panic attacks, migraines  TVUS 08/23/23 CLINICAL DATA:  Pelvic pain - Right Lower Quadrant   EXAM: TRANSABDOMINAL AND TRANSVAGINAL ULTRASOUND OF PELVIS   TECHNIQUE: Both transabdominal and transvaginal ultrasound examinations of the pelvis were performed. Transabdominal technique was performed for global imaging of the pelvis including uterus, ovaries, adnexal regions, and pelvic cul-de-sac. It was necessary to proceed with endovaginal exam following the transabdominal exam to visualize the uterus and adnexae in better detail.   COMPARISON:  None Available.   FINDINGS: Uterus   Measurements: 7.6 x 2.6 x 4.0 cm = volume: 41.5 mL. No fibroids or other mass visualized. Anteverted position.   Endometrium   Thickness: 2 mm. No focal abnormality visualized. Linear echogenic IUD appropriately position within the endometrial cavity.   Right ovary   Measurements: 2.3 x 1.7 x 1.8 cm = volume: 3.5 mL. Normal appearance/no adnexal mass.   Left ovary   Measurements: 2.8 x 1.0 x 2.1 cm = volume: 3.1 mL. Normal appearance/no adnexal mass.   Other findings   No abnormal free fluid.   IMPRESSION: 1. Normal pelvic ultrasound. 2. IUD appropriately position within the endometrial cavity.     Electronically  Signed   By: CHRISTELLA.  Shick M.D.   On: 09/02/2023 21:18  Urinary Symptoms: Does not leak urine.  Leaks *** time(s) per {days/wks/mos/yrs:310907}.  Pad use: {NUMBERS 1-10:18281} {pad option:24752} per day.   Patient {ACTION; IS/IS WNU:78978602} bothered by UI symptoms.  Day time voids 3.  Nocturia: 1 times per night to void. Voiding dysfunction:  empties bladder well.  Patient does not use a catheter to empty bladder.  When urinating, patient feels to push on her belly or vagina to empty bladder Drinks: ***oz water per day  UTIs: 1 UTI's in the last year.   {ACTIONS;DENIES/REPORTS:21021675::Denies} history of blood in urine, kidney or bladder stones, pyelonephritis, bladder cancer, and kidney cancer No results found for the last 90 days.   Pelvic Organ Prolapse Symptoms:                  Patient Denies a feeling of a bulge the vaginal area.   Bowel Symptom: Bowel movements: 1-2 time(s) per day Stool consistency: hard Straining: yes.  Splinting: yes.  Incomplete evacuation: yes.  Patient Denies accidental bowel leakage / fecal incontinence Bowel regimen: Picot Last colonoscopy: Not due for age based screening Date ***, Results *** [facility] Colonoscopy   This patient has no relevant Health Maintenance data.     Sexual Function Sexually active: no.  Sexual orientation: {Sexual Orientation:[phone]} Pain with sex: Yes, at the vaginal opening, deep in the pelvis  Pelvic Pain Denies pelvic pain   Past Medical History:  Past Medical History:  Diagnosis Date   GERD (gastroesophageal reflux disease)    Migraines    Panic attacks      Past Surgical History:  Past Surgical History:  Procedure Laterality Date   CESAREAN SECTION  11/23/2011   Procedure: CESAREAN SECTION;  Surgeon: Olam Mill, MD;  Location: [facility];  Service: Gynecology;  Laterality: N/A;  Primary Cesarean Section Delivery Boy @ [phone],  Apgars 6/8     Past OB/GYN History: OB History  Gravida Para  Term Preterm AB Living  1 1 1  0 0 1  SAB IAB Ectopic Multiple Live Births  0 0 0  1    # Outcome Date GA Lbr Len/2nd Weight Sex Type Anes PTL Lv  1 Term 11/23/11 [redacted]w[redacted]d 19:15 / 04:25  M CS-LTranv EPI      Vaginal deliveries: ***,  Forceps/ Vacuum deliveries: ***, Cesarean section: *** Menopausal: {menopausal:24763} Contraception: ***. Last pap smear was ***.  Any history of abnormal pap smears: {yes/no:19897}.    Component Value Date/Time   DIAGPAP  08/16/2023 1642    - Negative for intraepithelial lesion or malignancy (NILM)   DIAGPAP  01/05/2022 1512    - Negative for intraepithelial lesion or malignancy (NILM)   DIAGPAP  04/10/2019 1019    - Negative for Intraepithelial Lesions or Malignancy (NILM)   DIAGPAP - Benign reactive/reparative changes 04/10/2019 1019   HPVHIGH Negative 08/16/2023 1642   ADEQPAP  08/16/2023 1642    Satisfactory for evaluation; transformation zone component PRESENT.   ADEQPAP  01/05/2022 1512    Satisfactory for evaluation; transformation zone component ABSENT.   ADEQPAP  04/10/2019 1019    Satisfactory for evaluation; transformation zone component PRESENT.    Medications: Patient has a current medication list which includes the following prescription(s): cefuroxime , docusate sodium , fluconazole , ibuprofen , ketoconazole , metronidazole , phenazopyridine , and polyethylene glycol.   Allergies: Patient is allergic to topamax  [topiramate ].   Social History:  Social History   Tobacco Use   Smoking status: Never   Smokeless tobacco: Never  Vaping Use   Vaping status: Never Used  Substance Use Topics   Alcohol use: No   Drug use: No    Relationship status: {relationship status:24764} Patient lives with ***.   Patient {ACTION; IS/IS WNU:78978602} employed ***. Regular exercise: {Yes/No:304960894} History of abuse: {Yes/No:304960894}  Family History:   Family History  Problem Relation Age of Onset   Hyperthyroidism Mother    Asthma Brother     Hypertension Maternal Grandmother    Hyperthyroidism Maternal Grandmother      Review of Systems: Review of Systems  Constitutional:  Negative for fever, malaise/fatigue and weight loss.       Weight gain  Respiratory:  Positive for shortness of breath. Negative for cough and wheezing.   Cardiovascular:  Negative for chest pain, palpitations and leg swelling.  Gastrointestinal:  Negative for abdominal pain and blood in stool.  Genitourinary:  Negative for hematuria.       Vaginal discharge  Skin:  Negative for rash.  Neurological:  Negative for dizziness, weakness and headaches.  Endo/Heme/Allergies:  Does not bruise/bleed easily.  Psychiatric/Behavioral:  Negative for depression. The patient is not nervous/anxious.      OBJECTIVE Physical Exam: There were no vitals filed for this visit.  Physical Exam   GU / Detailed Urogynecologic Evaluation:  Pelvic Exam: Normal external female genitalia; Bartholin's and Skene's glands normal in appearance; urethral meatus normal in appearance, no urethral masses or discharge.   CST: {gen negative/positive:315881}  Reflexes: bulbocavernosis {DESC; PRESENT/NOT PRESENT:21021351}, anocutaneous {DESC; PRESENT/NOT PRESENT:21021351} ***bilaterally.  Speculum exam reveals normal vaginal mucosa {With/Without:20273} atrophy. Cervix {exam; gyn cervix:30847}. Uterus {exam; pelvic uterus:30849}. Adnexa {exam; adnexa:12223}.  s/p hysterectomy: Speculum exam reveals normal vaginal mucosa {With/Without:20273}  atrophy and normal vaginal cuff.  Adnexa {exam; adnexa:12223}.    With apex supported, anterior compartment defect was {reduced:24765}  Pelvic floor strength {Roman # I-V:19040}/V, puborectalis {Roman # I-V:19040}/V external anal sphincter {Roman # I-V:19040}/V  Pelvic floor musculature: Right levator {Tender/Non-tender:20250}, Right obturator {Tender/Non-tender:20250}, Left levator {Tender/Non-tender:20250}, Left obturator  {Tender/Non-tender:20250}  POP-Q:   POP-Q                                               Aa                                               Ba                                                 C                                                Gh                                               Pb                                               tvl                                                Ap                                               Bp                                                 D      Rectal Exam:  Normal sphincter tone, {rectocele:24766} distal rectocele, enterocoele {DESC; PRESENT/NOT PRESENT:21021351}, no rectal masses, {sign of:24767} dyssynergia when asking the patient to bear down.  Post-Void Residual (PVR) by Bladder Scan: In order to evaluate bladder emptying, we discussed obtaining a postvoid residual and patient agreed to this procedure.  Procedure: The ultrasound unit was placed on the patient's abdomen in the suprapubic region after the patient had voided.      Laboratory Results: Lab Results  Component Value Date   COLORU Yellow 11/22/2021   CLARITYU Cloudy 11/22/2021   GLUCOSEUR  Negative 08/16/2023   BILIRUBINUR small 08/16/2023   KETONESU negative 08/16/2023   SPECGRAV 1.025 08/16/2023   RBCUR negative 08/16/2023   PHUR 6.0 08/16/2023   PROTEINUR Positive (A) 08/16/2023   UROBILINOGEN 2.0 (A) 08/16/2023   LEUKOCYTESUR Moderate (2+) (A) 08/16/2023    Lab Results  Component Value Date   CREATININE 1.02 (H) 01/05/2022   CREATININE 0.92 10/27/2014   CREATININE 0.91 10/10/2014    Lab Results  Component Value Date   HGBA1C 5.3 01/05/2022    Lab Results  Component Value Date   HGB 13.7 01/05/2022     ASSESSMENT AND PLAN Rachel Mcclure is a 28 y.o. with: No diagnosis found.  There are no diagnoses linked to this encounter.   Rachel ONEIDA Gillis, MD

## 2023-10-10 ENCOUNTER — Ambulatory Visit: Admitting: Obstetrics

## 2023-10-10 ENCOUNTER — Encounter: Payer: Self-pay | Admitting: Obstetrics

## 2023-10-10 ENCOUNTER — Other Ambulatory Visit (HOSPITAL_COMMUNITY)
Admission: RE | Admit: 2023-10-10 | Discharge: 2023-10-10 | Disposition: A | Discharge status: Home / Self Care | Source: Ambulatory Visit | Attending: Obstetrics | Admitting: Obstetrics

## 2023-10-10 VITALS — BP 114/76 | HR 51 | Ht 60.63 in | Wt 136.0 lb

## 2023-10-10 DIAGNOSIS — R3914 Feeling of incomplete bladder emptying: Secondary | ICD-10-CM

## 2023-10-10 DIAGNOSIS — K5900 Constipation, unspecified: Secondary | ICD-10-CM | POA: Diagnosis not present

## 2023-10-10 DIAGNOSIS — R102 Pelvic and perineal pain: Secondary | ICD-10-CM | POA: Insufficient documentation

## 2023-10-10 DIAGNOSIS — N94819 Vulvodynia, unspecified: Secondary | ICD-10-CM | POA: Diagnosis not present

## 2023-10-10 DIAGNOSIS — N941 Unspecified dyspareunia: Secondary | ICD-10-CM | POA: Diagnosis not present

## 2023-10-10 LAB — POCT URINALYSIS DIP (CLINITEK)
Bilirubin, UA: NEGATIVE
Blood, UA: NEGATIVE
Glucose, UA: NEGATIVE mg/dL
Ketones, POC UA: NEGATIVE mg/dL
Leukocytes, UA: NEGATIVE
Nitrite, UA: NEGATIVE
POC PROTEIN,UA: NEGATIVE
Spec Grav, UA: 1.02 (ref 1.010–1.025)
Urobilinogen, UA: 0.2 U/dL
pH, UA: 5.5 (ref 5.0–8.0)

## 2023-10-10 MED ORDER — DICLOFENAC SODIUM 1 % EX GEL: 2.0000 g | Freq: Four times a day (QID) | CUTANEOUS | 1 refills | Status: AC

## 2023-10-10 MED ORDER — LIDOCAINE 5 % EX OINT: TOPICAL_OINTMENT | CUTANEOUS | 0 refills | Status: AC

## 2023-10-10 NOTE — Assessment & Plan Note (Signed)
-   provided handout regarding vulvodynia, reviewed comfort measures and treatment options.  - Rx topical lidocaine  1g PRN pain up to 3x/day - Consider Rx Amitriptyline 2.5%/ gabapentin 2.5%/ baclofen 2.5% in vaginal cream. Will be sent to compounding pharmacy for use daily if refractory to lidocaine  - discussed conservative management options with cold compress after intercourse and vulvar care - lubrication use and lidocaine  during intercourse - pending pelvic floor PT appointment - [employer]  handout reviewed and provided to patient - encouraged to consider Cymbalta for nerve pain and mood disorder

## 2023-10-10 NOTE — Assessment & Plan Note (Addendum)
-   started after IUD placement in 2019, TVUS with IUD in intrauterine position and IUD string noted on exam - pain with light tough of right vulva and bilateral pelvic floor myofascial pain, along with SI Joint pain and abdominal wall pain - pending Nuswab to r/o infectious etiology - referral to pelvic floor PT for musculoskeletal pain - encouraged pelvic floor relaxation exercises, heating pad, NSAIDs, tylenol   - Rx topical lidocaine  at vulva prior to pelvic floor PT and intercourse - encouraged position changes and lubrication use as needed - encouraged to review with Dr. Rudy regarding IUD removal to reassess pain if refractory after treatment of musculoskeletal pain

## 2023-10-10 NOTE — Patient Instructions (Addendum)
 The origin of pelvic floor muscle spasm can be multifactorial, including primary, reactive to a different pain source, trauma, or even part of a centralized pain syndrome.Treatment options include pelvic floor physical therapy, local (vaginal) or oral  muscle relaxants, pelvic muscle trigger point injections or centrally acting pain medications.    Please call [phone] to schedule the earliest appointment for pelvic floor PT.  You can use Ibuprofen  up to 600mg  every 6 hours, Tylenol  500mg  every 6 hours, heating pad, and stretching exercises for pain.   Use topical lidocaine  15-80min prior to intercourse and continue position changes for comfort.  Constipation: Our goal is to achieve formed bowel movements daily or every-other-day.  You may need to try different combinations of the following options to find what works best for you - everybody's body works differently so feel free to adjust the dosages as needed.  Some options to help maintain bowel health include:  Dietary changes (more leafy greens, vegetables and fruits; less processed foods) Fiber supplementation (Benefiber, FiberCon, Metamucil or Psyllium). Start slow and increase gradually to full dose. Over-the-counter agents such as: stool softeners (Docusate or Colace) and/or laxatives (Miralax , milk of magnesia)  Power Pudding is a natural mixture that may help your constipation.  To make blend 1 cup applesauce, 1 cup wheat bran, and 3/4 cup prune juice, refrigerate and then take 1 tablespoon daily with a large glass of water as needed.   Women should try to eat at least 21 to 25 grams of fiber a day, while men should aim for 30 to 38 grams a day. You can add fiber to your diet with food or a fiber supplement such as psyllium (metamucil), benefiber, or fibercon.   Here's a look at how much dietary fiber is found in some common foods. When buying packaged foods, check the Nutrition Facts label for fiber content. It can vary among  brands.  Fruits Serving size Total fiber (grams)*  Raspberries 1 cup 8.0  Pear 1 medium 5.5  Apple, with skin 1 medium 4.5  Banana 1 medium 3.0  Orange 1 medium 3.0  Strawberries 1 cup 3.0   Vegetables Serving size Total fiber (grams)*  Green peas, boiled 1 cup 9.0  Broccoli, boiled 1 cup chopped 5.0  Turnip greens, boiled 1 cup 5.0  Brussels sprouts, boiled 1 cup 4.0  Potato, with skin, baked 1 medium 4.0  Sweet corn, boiled 1 cup 3.5  Cauliflower, raw 1 cup chopped 2.0  Carrot, raw 1 medium 1.5   Grains Serving size Total fiber (grams)*  Spaghetti, whole-wheat, cooked 1 cup 6.0  Barley, pearled, cooked 1 cup 6.0  Bran flakes 3/4 cup 5.5  Quinoa, cooked 1 cup 5.0  Oat bran muffin 1 medium 5.0  Oatmeal, instant, cooked 1 cup 5.0  Popcorn, air-popped 3 cups 3.5  Brown rice, cooked 1 cup 3.5  Bread, whole-wheat 1 slice 2.0  Bread, rye 1 slice 2.0   Legumes, nuts and seeds Serving size Total fiber (grams)*  Split peas, boiled 1 cup 16.0  Lentils, boiled 1 cup 15.5  Black beans, boiled 1 cup 15.0  Baked beans, canned 1 cup 10.0  Chia seeds 1 ounce 10.0  Almonds 1 ounce (23 nuts) 3.5  Pistachios 1 ounce (49 nuts) 3.0  Sunflower kernels 1 ounce 3.0  *Rounded to nearest 0.5 gram. Source: [employer] for [employer], [employer] Release    Consider returning to therapy for your mood disorder

## 2023-10-10 NOTE — Assessment & Plan Note (Addendum)
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   - referral to pelvic floor PT, encouraged muscle relaxation exercises, stress management - Rx Voltaren  gel for use up to 2g 4x/day as needed for lower back and abdominal pain - Rx topical lidocaine  PRN intercourse and pelvic floor PT

## 2023-10-10 NOTE — Assessment & Plan Note (Signed)
-   distended bladder noted on ultrasound 08/23/23 after voiding x 2 - history of constipation, migraine and dyspareunia - triggered by spicy food - catheterized for 20mL  - repeat at follow-up or if clinical change - discussed possible bladder pain due to overlapping symptomatology  - For irritative bladder we reviewed treatment options including altering her diet to avoid irritative beverages and foods as well as attempting to decrease stress and other exacerbating factors.  We also discussed using pyridium  and similar over-the-counter medications for pain relief as needed. We discussed the pentad of medications including Tums, an antihistamine such as Vistaril , amitriptyline, and L-arginine.  We also discussed in-office bladder instillations for pain flares, as well as cystoscopy with hydrodistention in the operating room, which can be both diagnostic and therapeutic. She was also given information on the [employer] Network at https://www.ic-network.com for bladder diet suggestions and patient forums for support. - encouraged pt to return if change in urinary symptoms for reassessment and possible bladder instillation

## 2023-10-10 NOTE — Assessment & Plan Note (Addendum)
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  We also discussed the importance of avoiding chronic straining, as it can exacerbate her pelvic floor symptoms; we discussed treating constipation and straining prior to surgery, as postoperative straining can lead to damage to the repair and recurrence of symptoms. We discussed initiating therapy with increasing fluid intake, fiber supplementation, stool softeners, and laxatives such as miralax .  - encouraged squatting position to ensure pelvic floor relaxation during defecation and titration of daily miralax  or fiber supplementation - discussed association with pelvic floor disorders

## 2023-10-11 ENCOUNTER — Ambulatory Visit: Payer: Self-pay | Admitting: Obstetrics

## 2023-10-11 DIAGNOSIS — N94819 Vulvodynia, unspecified: Secondary | ICD-10-CM

## 2023-10-11 DIAGNOSIS — N898 Other specified noninflammatory disorders of vagina: Secondary | ICD-10-CM

## 2023-10-11 LAB — CERVICOVAGINAL ANCILLARY ONLY
Bacterial Vaginitis (gardnerella): POSITIVE — AB
Candida Glabrata: NEGATIVE
Candida Vaginitis: POSITIVE — AB
Comment: NEGATIVE
Comment: NEGATIVE
Comment: NEGATIVE

## 2023-10-11 MED ORDER — FLUCONAZOLE 200 MG PO TABS: 200.0000 mg | ORAL_TABLET | ORAL | 1 refills | Status: DC

## 2023-10-11 MED ORDER — METRONIDAZOLE 500 MG PO TABS: 500.0000 mg | ORAL_TABLET | Freq: Two times a day (BID) | ORAL | 0 refills | Status: DC

## 2023-10-30 ENCOUNTER — Other Ambulatory Visit: Payer: Self-pay

## 2023-10-30 ENCOUNTER — Ambulatory Visit: Attending: Obstetrics

## 2023-10-30 DIAGNOSIS — R279 Unspecified lack of coordination: Secondary | ICD-10-CM | POA: Insufficient documentation

## 2023-10-30 DIAGNOSIS — R102 Pelvic and perineal pain: Secondary | ICD-10-CM | POA: Diagnosis not present

## 2023-10-30 DIAGNOSIS — M5459 Other low back pain: Secondary | ICD-10-CM | POA: Insufficient documentation

## 2023-10-30 DIAGNOSIS — M6281 Muscle weakness (generalized): Secondary | ICD-10-CM | POA: Insufficient documentation

## 2023-10-30 DIAGNOSIS — M62838 Other muscle spasm: Secondary | ICD-10-CM | POA: Insufficient documentation

## 2023-10-30 DIAGNOSIS — R293 Abnormal posture: Secondary | ICD-10-CM | POA: Diagnosis not present

## 2023-10-30 NOTE — Patient Instructions (Addendum)
Squatty potty: When your knees are level or below the level of your hips, pelvic floor muscles are pressed against rectum, preventing ease of bowel movement. By getting knees above the level of the hips, these pelvic floor muscles relax, allowing easier passage of bowel movement. Ways to get knees above hips: o Squatty Potty (7inch and 9inch versions) o Small stool o Roll of toilet paper under each foot o Hardback book or stack of magazines under each foot  Relaxed Toileting mechanics: Once in this position, make sure to lean forward with forearms on thighs, wide knees, relaxed stomach, and breathe.   Double-voiding:  This technique is to help with post-void dribbling, or leaking a little bit when you stand up right after urinating.  Use relaxed toileting mechanics to urinate as much as you feel like you have to without straining.  Sit back upright from leaning forward and relax this way for 10-20 seconds.  Lean forward again to finish voiding any amount more.    [facility] [address], [address] [place], [place] [address] Phone # [phone] Fax [phone]  

## 2023-10-30 NOTE — Therapy (Signed)
 OUTPATIENT PHYSICAL THERAPY FEMALE PELVIC EVALUATION   Patient Name: Rachel Mcclure MRN: [id number] DOB:12/16/1995, 28 y.o., female Today's Date: 10/30/2023  END OF SESSION:  PT End of Session - 10/30/23 1612     Visit Number 1    Date for PT Re-Evaluation 04/15/24    Authorization Type [employer]    Authorization Time Period waiting on auth    PT Start Time 1611    PT Stop Time 1655    PT Time Calculation (min) 44 min    Activity Tolerance Patient tolerated treatment well    Behavior During Therapy [facility] for tasks assessed/performed          Past Medical History:  Diagnosis Date   GERD (gastroesophageal reflux disease)    Migraines    Panic attacks    Past Surgical History:  Procedure Laterality Date   CESAREAN SECTION  11/23/2011   Procedure: CESAREAN SECTION;  Surgeon: Olam Mill, MD;  Location: [facility];  Service: Gynecology;  Laterality: N/A;  Primary Cesarean Section Delivery Boy @ [phone],  Apgars 6/8   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   Pelvic pain 10/10/2023   Cough 08/10/2017   Allergic rhinitis 07/25/2017   Feeling tired 01/04/2017   Panic attack as reaction to stress 02/17/2014   GERD (gastroesophageal reflux disease) 01/23/2014    PCP: Joshua Domino, DO  REFERRING PROVIDER: Rudy Carlin LABOR, MD   REFERRING DIAG: R10.2 (ICD-10-CM) - Pelvic pain  THERAPY DIAG:  Pelvic pain - Plan: PT plan of care cert/re-cert  Other low back pain - Plan: PT plan of care cert/re-cert  Other muscle spasm - Plan: PT plan of care cert/re-cert  Muscle weakness (generalized) - Plan: PT plan of care cert/re-cert  Unspecified lack of coordination - Plan: PT plan of care cert/re-cert  Abnormal posture - Plan: PT plan of care cert/re-cert  Rationale for Evaluation and Treatment: Rehabilitation  ONSET DATE:  09/02/2023  SUBJECTIVE:                                                                                                                                                                                           SUBJECTIVE STATEMENT: Pt here for Rt lower quadrant pain, incomplete bladder emptying, low back pain, and bladder spasms. She initially was treated for pyelonephritis and dysuria with antibiotics and then diflucan . She has been taking ibuprofen  but it makes her sleepy. Pain first started during intercourse.    PAIN:  Are you having pain? Yes  NPRS scale: 8/10 during intercourse; 10/10 low back pain Pain location: pelvic pain, bil; bil low back; vaginal  Pain type: aching, dull, sharp, and tight Pain description: intermittent   Aggravating factors: household chores, lifting, bending (giving dogs a bath); intimacy Relieving factors: ibuprofen , lying down  PRECAUTIONS: None  RED FLAGS: None   WEIGHT BEARING RESTRICTIONS: No  FALLS:  Has patient fallen in last 6 months? No  OCCUPATION: not working  ACTIVITY LEVEL : none  PLOF: Independent  PATIENT GOALS: decrease pain; have pain free intercourse  PERTINENT HISTORY:  C-section 2013, panic attacks, GERD, constipation, vulvodynia, incomplete bladder emptying, dyspareunia, pelvic pain Sexual abuse: No  BOWEL MOVEMENT: Pain with bowel movement: Yes Type of bowel movement:Type (Bristol Stool Scale) 1-3, Frequency 1-2x/day, and Strain yes, sometimes she reports bleeding Fully empty rectum: Yes: sometimes Leakage: No Pads: No Fiber supplement/laxative has been instructed to take Miralax  daily, but she has not  URINATION: Pain with urination: No Fully empty bladder: No - has been told she has a residual Stream: sometimes hard to start  Urgency: Yes - this will also cause low back pain Frequency: every 30 minutes-1 hour; 1-2x/night Fluid Intake: tries to drink a lot of water; soda Leakage: none Pads:  No  INTERCOURSE:  Ability to have vaginal penetration Yes  - sometimes Pain with intercourse: Initial Penetration, During Penetration, and Deep Penetration Dryness: sometimes Climax: WNL Marinoff Scale: 2/3 Lubricant:   PREGNANCY: Vaginal deliveries 0 C-section deliveries 1 Currently pregnant No  PROLAPSE: None   OBJECTIVE:  Note: Objective measures were completed at Evaluation unless otherwise noted.  10/30/23: PATIENT SURVEYS:   PFIQ-7: 100  COGNITION: Overall cognitive status: Within functional limits for tasks assessed     SENSATION: Light touch: Appears intact   FUNCTIONAL TESTS:  Squat: painful in low back Single leg stance:  Rt: pelvic drop  Lt: WNL Curl-up test: abdominal distortion ACTIVE STRAIGHT LEG RAISE: positive with decreased pain bil   GAIT: Assistive device utilized: None Comments: WNL  POSTURE: rounded shoulders, forward head, decreased lumbar lordosis, posterior pelvic tilt, and Lt thoracic curvature    LUMBARAROM/PROM: Pain in low back with all movements  A/PROM A/PROM  Eval (% available)  Flexion 50  Extension 50  Right lateral flexion WNL  Left lateral flexion WNL  Right rotation WNL  Left rotation WNL   (Blank rows = not tested)  PALPATION:   General: tightness in bil glutes and lumbar paraspinals; hypermobility of lumbar spine with mobilizations at all levels including sacral base; tenderness at pubic symphysis  Pelvic Alignment: Rt posterior rotation  Abdominal: c-section scar tissue restriction and pain/sensitivity; apical breathing pattern, significant tenderness throughout lower abdomen                External Perineal Exam: WNL                             Internal Pelvic Floor: pain and spasm throughout superficial and deep pelvic floor muscles, Lt>Rt  Patient confirms identification and approves PT to assess internal pelvic floor and treatment Yes  PELVIC MMT:   MMT eval  Vaginal No active contraction - bulges  with attempt - multimodal cues did not change  Diastasis Recti 2 finger width diastasis  (Blank rows = not tested)        TONE: High  PROLAPSE: WNL, but coordination was poor  TODAY'S TREATMENT:  DATE:  10/30/23 EVAL  Neuromuscular re-education: Down training HEP given: Cat cow  Child's pose Happy baby Butterfly Therapeutic activities: Self-scar tissue mobilization for c-section scar Squatty potty/relaxed toilet mechanics  Double voiding for more complete bladder emptying   For all possible CPT codes, reference the Planned Interventions line above.     Check all conditions that are expected to impact treatment: {Conditions expected to impact treatment:None of these apply   If treatment provided at initial evaluation, no treatment charged due to lack of authorization.       PATIENT EDUCATION:  Education details: See above Person educated: Patient Education method: Explanation, Demonstration, Tactile cues, Verbal cues, and Handouts Education comprehension: verbalized understanding  HOME EXERCISE PROGRAM: [id number]  ASSESSMENT:  CLINICAL IMPRESSION: Patient is a 28 y.o. female who was seen today for physical therapy evaluation and treatment for pelvic pain, urinary urgency, dyspareunia, and constipation. Exam findings  notable decreased lumbar flexion/extension, abnormal posture, core weakness, pain with squatting, abdominal tenderness, c-section scar tissue restriction, poor pelvic floor muscle coordination and inability to actively contract/relax, hypermobility of sacrum and lumbar spine, pain at pubic symphysis, and pain and high tone throughout superficial and deep pelvic floor muscles. Signs and symptoms are most consistent with hypermobility of pelvis and lumbar spine and associated muscle spasm in low back and pelvic floor muscles to help  stabilize. Initial treatment consisted of lifestyle modifications to help improve constipation, improve scar tissue mobility, and more completely empty bladder; initial down trianing/mobility HEP given. She will continue to benefit form skilled PT intervention in order to decrease pain, improve bowel/bladder function, address all impairments, improve quality of life, and progress strengthening program.   OBJECTIVE IMPAIRMENTS: decreased activity tolerance, decreased coordination, decreased endurance, decreased mobility, decreased ROM, decreased strength, increased fascial restrictions, increased muscle spasms, impaired flexibility, impaired tone, improper body mechanics, postural dysfunction, and pain.   ACTIVITY LIMITATIONS: lifting, bending, standing, squatting, sleeping, bed mobility, and locomotion level  PARTICIPATION LIMITATIONS: cleaning, laundry, and community activity  PERSONAL FACTORS: 3+ comorbidities: medical history are also affecting patient's functional outcome.   REHAB POTENTIAL: Good  CLINICAL DECISION MAKING: Evolving/moderate complexity  EVALUATION COMPLEXITY: Moderate   GOALS: Goals reviewed with patient? Yes  SHORT TERM GOALS: Target date: 11/27/2023   Pt will be independent with HEP in order to improve activity tolerance.   Baseline: none Goal status: INITIAL  2.  Pt will be independent with double voiding in order to more complete empty bladder, decreasing risk for infection. Baseline:  Goal status: INITIAL  3.  Pt will report no greater than 7/10 pelvic/low back pain with lumbar flexion in order to be able to pick up her house.  Baseline: 10/10 pain Goal status: INITIAL  4.  Pt will be able to teach back and utilize urge suppression technique in order to help reduce number of trips to the bathroom.    Baseline:  Goal status: INITIAL  5.  Pt will be independent with diaphragmatic breathing and down training activities in order to improve pelvic floor  relaxation and decrease pain.  Baseline: high tone pelvic floor  Goal status: INITIAL  6.  Pt will be able to perform full pelvic floor muscle contraction in order to improve mobility and circulation in pelvic floor in order to reduce urgency.  Baseline: no active contraction Goal status: INITIAL  LONG TERM GOALS: Target date: 04/16/2023  Pt will be independent with advanced HEP to improve activity tolerance.   Baseline:  Goal status: INITIAL  2.  Pt  will be able to go 2-3 hours in between voids without urgency or incontinence in order to improve QOL and perform all functional activities with less difficulty.   Baseline: going to the bathroom every 30 minutes Goal status: INITIAL  3.  Pt will report no greater than 2/10 pain with intercourse in order to have enjoyable intimate relationship with partner.  Baseline: 8/10 Goal status: INITIAL  4.  Pt will report no greater than 2/10 pelvic/low back pain with cleaning her dogs.  Baseline: 10/10 pain Goal status: INITIAL  5.  Pt will report complete bladder emptying in order to reduce risk of infection and decrease trips to the bathroom.  Baseline:  Goal status: INITIAL  6.  Pt will report complete emptying with bowel movements in order to decrease abdominal bloating and discomfort that prevents her form doing household chores.  Baseline:  Goal status: INITIAL  PLAN:  PT FREQUENCY: 1-2x/week  PT DURATION: 18 visits   PLANNED INTERVENTIONS: 97164- PT Re-evaluation, 97110-Therapeutic exercises, 97530- Therapeutic activity, 97112- Neuromuscular re-education, 97535- Self Care, 02859- Manual therapy, [phone]- Gait training, [phone]- Aquatic Therapy, [phone]- Electrical stimulation (unattended), [phone]- Traction (mechanical), [id number]- Ionotophoresis 4mg /ml Dexamethasone , 79439 (1-2 muscles), 20561 (3+ muscles)- Dry Needling, Patient/Family education, Balance training, Taping, Joint mobilization, Joint manipulation, Spinal manipulation, Spinal  mobilization, Scar mobilization, Vestibular training, Cryotherapy, Moist heat, and Biofeedback  PLAN FOR NEXT SESSION: mobility exercises; manual techniques to pelvic floor, bil hips, and lumbar paraspinals; core strengthening; abdominal scar tissue mobilization    Josette Mares, PT, DPT07/29/255:26 PM

## 2023-12-06 ENCOUNTER — Ambulatory Visit: Admitting: Physical Therapy

## 2023-12-13 ENCOUNTER — Ambulatory Visit: Attending: Obstetrics

## 2023-12-13 DIAGNOSIS — M6281 Muscle weakness (generalized): Secondary | ICD-10-CM | POA: Insufficient documentation

## 2023-12-13 DIAGNOSIS — M62838 Other muscle spasm: Secondary | ICD-10-CM | POA: Diagnosis present

## 2023-12-13 DIAGNOSIS — R102 Pelvic and perineal pain: Secondary | ICD-10-CM | POA: Insufficient documentation

## 2023-12-13 DIAGNOSIS — R279 Unspecified lack of coordination: Secondary | ICD-10-CM | POA: Insufficient documentation

## 2023-12-13 DIAGNOSIS — M5459 Other low back pain: Secondary | ICD-10-CM | POA: Diagnosis present

## 2023-12-13 DIAGNOSIS — R293 Abnormal posture: Secondary | ICD-10-CM | POA: Diagnosis present

## 2023-12-13 NOTE — Therapy (Addendum)
 OUTPATIENT PHYSICAL THERAPY FEMALE PELVIC TREATMENT   Patient Name: Rachel Mcclure MRN: [id number] DOB:02-06-1996, 28 y.o., female Today's Date: 12/13/2023  END OF SESSION:  PT End of Session - 12/13/23 1359     Visit Number 2    Date for PT Re-Evaluation 04/15/24    Authorization Type [employer]    Authorization Time Period 10/30/2023-12/28/2023    Authorization - Visit Number 1    Authorization - Number of Visits 5    PT Start Time 1400    PT Stop Time 1440    PT Time Calculation (min) 40 min    Activity Tolerance Patient tolerated treatment well    Behavior During Therapy WFL for tasks assessed/performed          Past Medical History:  Diagnosis Date   GERD (gastroesophageal reflux disease)    Migraines    Panic attacks    Past Surgical History:  Procedure Laterality Date   CESAREAN SECTION  11/23/2011   Procedure: CESAREAN SECTION;  Surgeon: Olam Mill, MD;  Location: [facility];  Service: Gynecology;  Laterality: N/A;  Primary Cesarean Section Delivery Boy @ [phone],  Apgars 6/8   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   Pelvic pain 10/10/2023   Cough 08/10/2017   Allergic rhinitis 07/25/2017   Feeling tired 01/04/2017   Panic attack as reaction to stress 02/17/2014   GERD (gastroesophageal reflux disease) 01/23/2014    PCP: Joshua Domino, DO  REFERRING PROVIDER: Rudy Carlin LABOR, MD   REFERRING DIAG: R10.2 (ICD-10-CM) - Pelvic pain  THERAPY DIAG:  Pelvic pain  Other low back pain  Other muscle spasm  Muscle weakness (generalized)  Unspecified lack of coordination  Abnormal posture  Rationale for Evaluation and Treatment: Rehabilitation  ONSET DATE: 09/02/2023  SUBJECTIVE:                                                                                                                                                                                            SUBJECTIVE STATEMENT: Pt states that she has been having normal bowel movements. She states that she now has good days and bad days with the pain.    PAIN: 12/13/23 Are you having pain? Yes NPRS scale: 6-7/10 Pain location: pelvic pain, bil; bil low back; vaginal  Pain type: aching, dull, sharp, and tight Pain description: intermittent   Aggravating factors: household chores, lifting, bending (giving dogs a bath); intimacy Relieving factors: ibuprofen , lying down  PRECAUTIONS: None  RED FLAGS:  None   WEIGHT BEARING RESTRICTIONS: No  FALLS:  Has patient fallen in last 6 months? No  OCCUPATION: not working  ACTIVITY LEVEL : none  PLOF: Independent  PATIENT GOALS: decrease pain; have pain free intercourse  PERTINENT HISTORY:  C-section 2013, panic attacks, GERD, constipation, vulvodynia, incomplete bladder emptying, dyspareunia, pelvic pain Sexual abuse: No  BOWEL MOVEMENT: Pain with bowel movement: Yes Type of bowel movement:Type (Bristol Stool Scale) 1-3, Frequency 1-2x/day, and Strain yes, sometimes she reports bleeding Fully empty rectum: Yes: sometimes Leakage: No Pads: No Fiber supplement/laxative has been instructed to take Miralax  daily, but she has not  URINATION: Pain with urination: No Fully empty bladder: No - has been told she has a residual Stream: sometimes hard to start  Urgency: Yes - this will also cause low back pain Frequency: every 30 minutes-1 hour; 1-2x/night Fluid Intake: tries to drink a lot of water; soda Leakage: none Pads: No  INTERCOURSE:  Ability to have vaginal penetration Yes  - sometimes Pain with intercourse: Initial Penetration, During Penetration, and Deep Penetration Dryness: sometimes Climax: WNL Marinoff Scale: 2/3 Lubricant:   PREGNANCY: Vaginal deliveries 0 C-section deliveries 1 Currently pregnant No  PROLAPSE: None   OBJECTIVE:  Note:  Objective measures were completed at Evaluation unless otherwise noted.  10/30/23: PATIENT SURVEYS:   PFIQ-7: 100  COGNITION: Overall cognitive status: Within functional limits for tasks assessed     SENSATION: Light touch: Appears intact   FUNCTIONAL TESTS:  Squat: painful in low back Single leg stance:  Rt: pelvic drop  Lt: WNL Curl-up test: abdominal distortion ACTIVE STRAIGHT LEG RAISE: positive with decreased pain bil   GAIT: Assistive device utilized: None Comments: WNL  POSTURE: rounded shoulders, forward head, decreased lumbar lordosis, posterior pelvic tilt, and Lt thoracic curvature    LUMBARAROM/PROM: Pain in low back with all movements  A/PROM A/PROM  Eval (% available)  Flexion 50  Extension 50  Right lateral flexion WNL  Left lateral flexion WNL  Right rotation WNL  Left rotation WNL   (Blank rows = not tested)  PALPATION:   General: tightness in bil glutes and lumbar paraspinals; hypermobility of lumbar spine with mobilizations at all levels including sacral base; tenderness at pubic symphysis  Pelvic Alignment: Rt posterior rotation  Abdominal: c-section scar tissue restriction and pain/sensitivity; apical breathing pattern, significant tenderness throughout lower abdomen                External Perineal Exam: WNL                             Internal Pelvic Floor: pain and spasm throughout superficial and deep pelvic floor muscles, Lt>Rt  Patient confirms identification and approves PT to assess internal pelvic floor and treatment Yes  PELVIC MMT:   MMT eval  Vaginal No active contraction - bulges with attempt - multimodal cues did not change  Diastasis Recti 2 finger width diastasis  (Blank rows = not tested)        TONE: High  PROLAPSE: WNL, but coordination was poor  TODAY'S TREATMENT:  DATE:   12/13/23 Manual: Supine bowel mobilization Prone myofascial release to thoracolumbar fascia Prone soft tissue mobilization to lumbar paraspinals and quadratus lumborum, Rt>Lt Supine bladder mobilization  Supine negative pressure soft tissue mobilization to bil low back Neuromuscular re-education: Supine hip adduction 10x Supine unilateral bent knee fall out + red band 10x bil Bridge with hip adduction, transversus abdominus, and pelvic floor muscle 2 x 10 Exercises: Lower trunk rotation 2 x 10 Single knee to chest 5x bil Open books 10x bil    10/30/23 EVAL  Neuromuscular re-education: Down training HEP given: Cat cow  Child's pose Happy baby Butterfly Therapeutic activities: Self-scar tissue mobilization for c-section scar Squatty potty/relaxed toilet mechanics  Double voiding for more complete bladder emptying   For all possible CPT codes, reference the Planned Interventions line above.     Check all conditions that are expected to impact treatment: {Conditions expected to impact treatment:None of these apply   If treatment provided at initial evaluation, no treatment charged due to lack of authorization.       PATIENT EDUCATION:  Education details: See above Person educated: Patient Education method: Explanation, Demonstration, Tactile cues, Verbal cues, and Handouts Education comprehension: verbalized understanding  HOME EXERCISE PROGRAM: [id number]  ASSESSMENT:  CLINICAL IMPRESSION: Patient is a 28 y.o. female who was seen today for physical therapy evaluation and treatment for pelvic pain, urinary urgency, dyspareunia, and constipation. Pt doing much better with bowel movements and emptying bladder; she also reports that pain is better, but she still has it. Pain is focused in Rt side of low back. Manual techniques performed to abdomen with notable tenderness right over bladder. We also performed myofascial and soft tissue mobilization to thoracolumbar fascia,  lumbar paraspinals, and quadratus lumborum with good tolerance and release. She tolerated all exercise progressions well, but she does demonstrate apprehension with movement and significant weakness. She will continue to benefit form skilled PT intervention in order to decrease pain, improve bowel/bladder function, address all impairments, improve quality of life, and progress strengthening program.   OBJECTIVE IMPAIRMENTS: decreased activity tolerance, decreased coordination, decreased endurance, decreased mobility, decreased ROM, decreased strength, increased fascial restrictions, increased muscle spasms, impaired flexibility, impaired tone, improper body mechanics, postural dysfunction, and pain.   ACTIVITY LIMITATIONS: lifting, bending, standing, squatting, sleeping, bed mobility, and locomotion level  PARTICIPATION LIMITATIONS: cleaning, laundry, and community activity  PERSONAL FACTORS: 3+ comorbidities: medical history are also affecting patient's functional outcome.   REHAB POTENTIAL: Good  CLINICAL DECISION MAKING: Evolving/moderate complexity  EVALUATION COMPLEXITY: Moderate   GOALS: Goals reviewed with patient? Yes  SHORT TERM GOALS: Updated 12/13/23   Pt will be independent with HEP in order to improve activity tolerance.   Baseline:  Goal status: MET 12/13/23  2.  Pt will be independent with double voiding in order to more complete empty bladder, decreasing risk for infection. Baseline:  Goal status: MET 12/13/23  3.  Pt will report no greater than 7/10 pelvic/low back pain with lumbar flexion in order to be able to pick up her house.  Baseline: 10/10 pain Goal status: IN PROGRESS 12/13/23  4.  Pt will be able to teach back and utilize urge suppression technique in order to help reduce number of trips to the bathroom.    Baseline:  Goal status: MET 12/13/23  5.  Pt will be independent with diaphragmatic breathing and down training activities in order to improve pelvic  floor relaxation and decrease pain.  Baseline: high tone pelvic floor  Goal status: IN  PROGRESS 12/13/23  6.  Pt will be able to perform full pelvic floor muscle contraction in order to improve mobility and circulation in pelvic floor in order to reduce urgency.  Baseline: no active contraction Goal status: IN PROGRESS 12/13/23  LONG TERM GOALS: Target date: 04/16/2023  Pt will be independent with advanced HEP to improve activity tolerance.   Baseline:  Goal status: IN PROGRESS 12/13/23  2.  Pt will be able to go 2-3 hours in between voids without urgency or incontinence in order to improve QOL and perform all functional activities with less difficulty.   Baseline: going to the bathroom every 30 minutes Goal status: IN PROGRESS 12/13/23  3.  Pt will report no greater than 2/10 pain with intercourse in order to have enjoyable intimate relationship with partner.  Baseline: 8/10 Goal status: IN PROGRESS 12/13/23  4.  Pt will report no greater than 2/10 pelvic/low back pain with cleaning her dogs.  Baseline: 10/10 pain Goal status: IN PROGRESS 12/13/23  5.  Pt will report complete bladder emptying in order to reduce risk of infection and decrease trips to the bathroom.  Baseline:  Goal status: IN PROGRESS 12/13/23  6.  Pt will report complete emptying with bowel movements in order to decrease abdominal bloating and discomfort that prevents her form doing household chores.  Baseline:  Goal status: IN PROGRESS 12/13/23  PLAN:  PT FREQUENCY: 1-2x/week  PT DURATION: 18 visits   PLANNED INTERVENTIONS: 97164- PT Re-evaluation, 97110-Therapeutic exercises, 97530- Therapeutic activity, 97112- Neuromuscular re-education, 97535- Self Care, 02859- Manual therapy, [phone]- Gait training, [phone]- Aquatic Therapy, [phone]- Electrical stimulation (unattended), [phone]- Traction (mechanical), [phone]- Ionotophoresis 4mg /ml Dexamethasone , 79439 (1-2 muscles), 20561 (3+ muscles)- Dry Needling, Patient/Family  education, Balance training, Taping, Joint mobilization, Joint manipulation, Spinal manipulation, Spinal mobilization, Scar mobilization, Vestibular training, Cryotherapy, Moist heat, and Biofeedback  PLAN FOR NEXT SESSION: mobility exercises; manual techniques to pelvic floor, bil hips, and lumbar paraspinals; core strengthening; abdominal scar tissue mobilization    Josette Mares, PT, DPT09/11/252:47 PM  PHYSICAL THERAPY DISCHARGE SUMMARY  Visits from Start of Care: 2  Current functional level related to goals / functional outcomes: Not independent   Remaining deficits: See above   Education / Equipment: HEP   Patient agrees to discharge. Patient goals were partially met. Patient is being discharged due to not returning since the last visit.  Josette Mares, PT, DPT10/07/253:25 PM

## 2023-12-20 ENCOUNTER — Ambulatory Visit

## 2023-12-20 ENCOUNTER — Telehealth: Payer: Self-pay

## 2023-12-20 NOTE — Telephone Encounter (Signed)
 Called and left message after first no-show appointment for pelvic floor physical therapy. Confirmed next appointment in message. Reviewed needing to submit for new auth at next appointment. Reviewed attendance policy.

## 2023-12-27 ENCOUNTER — Ambulatory Visit

## 2024-01-08 ENCOUNTER — Ambulatory Visit: Admitting: Obstetrics and Gynecology

## 2024-01-08 ENCOUNTER — Telehealth: Payer: Self-pay

## 2024-01-08 ENCOUNTER — Ambulatory Visit: Attending: Obstetrics

## 2024-01-08 NOTE — Telephone Encounter (Signed)
 Called and spoke with patient after second no-show. We decided to discharge and cancel appointments for right now. She was told that she can return with new referral in the future.

## 2024-01-10 ENCOUNTER — Ambulatory Visit: Admitting: Obstetrics

## 2024-01-10 NOTE — Progress Notes (Deleted)
 [facility] Return Visit  SUBJECTIVE  History of Present Illness: ADIYA SELMER is a 28 y.o. female seen in follow-up for constipation, dyspareunia, pelvic girdle pain, feeling of incomplete bladder emptying, and vulvodynia. Plan at last visit was ***.     Past Medical History: Patient  has a past medical history of GERD (gastroesophageal reflux disease), Migraines, and Panic attacks.   Past Surgical History: She  has a past surgical history that includes Cesarean section (11/23/2011).   Medications: She has a current medication list which includes the following prescription(s): cefuroxime , diclofenac  sodium, docusate sodium , fluconazole , ibuprofen , ketoconazole , lidocaine , metronidazole , phenazopyridine , and polyethylene glycol.   Allergies: Patient is allergic to topamax  Anselmo.[email or web address] ].   Social History: Patient  reports that she has never smoked. She has never used smokeless tobacco. She reports that she does not drink alcohol and does not use drugs.     OBJECTIVE     Physical Exam: There were no vitals filed for this visit. Gen: No apparent distress, A&O x 3.  Detailed Urogynecologic Evaluation:  Deferred. Prior exam showed:      No data to display             ASSESSMENT AND PLAN    Ms. Coppa is a 28 y.o. with:  No diagnosis found.  There are no diagnoses linked to this encounter.   Lianne ONEIDA Gillis, MD

## 2024-01-17 ENCOUNTER — Encounter

## 2024-01-24 ENCOUNTER — Encounter

## 2024-01-29 ENCOUNTER — Encounter

## 2024-01-31 ENCOUNTER — Encounter

## 2024-02-07 ENCOUNTER — Encounter

## 2024-02-14 ENCOUNTER — Encounter

## 2024-02-21 ENCOUNTER — Encounter

## 2024-02-21 ENCOUNTER — Ambulatory Visit: Admitting: Physical Medicine and Rehabilitation

## 2024-02-21 ENCOUNTER — Encounter: Payer: Self-pay | Admitting: Physical Medicine and Rehabilitation

## 2024-02-21 DIAGNOSIS — M7918 Myalgia, other site: Secondary | ICD-10-CM

## 2024-02-21 DIAGNOSIS — G8929 Other chronic pain: Secondary | ICD-10-CM | POA: Diagnosis not present

## 2024-02-21 DIAGNOSIS — M545 Low back pain, unspecified: Secondary | ICD-10-CM | POA: Diagnosis not present

## 2024-02-21 NOTE — Progress Notes (Signed)
 Rachel Mcclure - 28 y.o. female MRN [id number]  Date of birth: Dec 07, 1995  Office Visit Note: Visit Date: 02/21/2024 PCP: Joshua Domino, DO (Inactive) Referred by: No ref. provider found  Subjective: Chief Complaint  Patient presents with   Lower Back - Pain   HPI: Rachel Mcclure is a 28 y.o. female who comes in today as a self referral for evaluation of chronic, worsening and severe bilateral lower back pain. Reports tightness and soreness to entire back. Pain ongoing for several years. She is concerned that epidural she received during child birth (2013) could of caused her symptoms. Her pain worsens with movement and activity. Describes pain as sore and locking sensation, currently rates as 7 out of 10. Some relief of pain with home exercise regimen, rest and use of medications. She did attend initial evaluation of physical therapy on 10/26/2023, this was more for pelvic PT, she did not return. Lumbar radiographs from 2021 show normal alignment and segmentation. Well preserved disc spacing, no spondylolisthesis. No prior lumbar MRI imaging. No history of lumbar surgery/injections. Patient denies focal weakness, numbness and tingling. No recent trauma or falls.      Review of Systems  Musculoskeletal:  Positive for back pain.  Neurological:  Negative for tingling, sensory change, focal weakness and weakness.  All other systems reviewed and are negative.  Otherwise per HPI.  Assessment & Plan: Visit Diagnoses:    ICD-10-CM   1. Chronic bilateral low back pain without sciatica  M54.50    G89.29     2. Myofascial pain syndrome  M79.18        Plan: Findings:  Chronic, worsening and severe bilateral lower back pain. No radicular symptoms. Patient continues to have severe pain despite good conservative therapies such as home exercise regimen, rest and use of medications. Patients clinical presentation and exam are consistent with myofascial pain syndrome. There is significant  myofascial tenderness noted to lumbar and thoracic paraspinal regions upon palpation. We discussed treatment plan in detail today. Next step is to place order for lumbar MRI imaging. I also placed order for short course of physical therapy. I think she would benefit from manual treatments and possible dry needling. She is currently living in [place], I provided paper order for PT, would recommend she try [facility] in [place]. We will see her back for lumbar MRI review. Her exam today is non focal, good strength to bilateral lower extremities.     Meds & Orders: No orders of the defined types were placed in this encounter.  No orders of the defined types were placed in this encounter.   Follow-up: Return for Lumbar MRI review.   Procedures: No procedures performed      Clinical History: No specialty comments available.   She reports that she has never smoked. She has never used smokeless tobacco. No results for input(s): HGBA1C, LABURIC in the last 8760 hours.  Objective:  VS:  HT:    WT:   BMI:     BP:   HR: bpm  TEMP: ( )  RESP:  Physical Exam Vitals and nursing note reviewed.  HENT:     Head: Normocephalic and atraumatic.     Right Ear: External ear normal.     Left Ear: External ear normal.     Nose: Nose normal.     Mouth/Throat:     Mouth: Mucous membranes are moist.  Eyes:     Extraocular Movements: Extraocular movements intact.  Cardiovascular:  Rate and Rhythm: Normal rate.     Pulses: Normal pulses.  Pulmonary:     Effort: Pulmonary effort is normal.  Abdominal:     General: Abdomen is flat. There is no distension.  Musculoskeletal:        General: Tenderness present.     Cervical back: Normal range of motion.     Comments: Patient rises from seated position to standing without difficulty. Good lumbar range of motion. No pain noted with facet loading. 5/5 strength noted with bilateral hip flexion, knee flexion/extension, ankle  dorsiflexion/plantarflexion and EHL. No clonus noted bilaterally. No pain upon palpation of greater trochanters. No pain with internal/external rotation of bilateral hips. Sensation intact bilaterally. Myofascial tenderness noted upon palpation of bilateral lumbar and thoracic paraspinal regions. Negative slump test bilaterally. Ambulates without aid, gait steady.     Skin:    General: Skin is warm and dry.     Capillary Refill: Capillary refill takes less than 2 seconds.  Neurological:     General: No focal deficit present.     Mental Status: She is alert and oriented to person, place, and time.  Psychiatric:        Mood and Affect: Mood normal.        Behavior: Behavior normal.     Ortho Exam  Imaging: No results found.  Past Medical/Family/Surgical/Social History: Medications & Allergies reviewed per EMR, new medications updated. 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   Pelvic pain 10/10/2023   Cough 08/10/2017   Allergic rhinitis 07/25/2017   Feeling tired 01/04/2017   Panic attack as reaction to stress 02/17/2014   GERD (gastroesophageal reflux disease) 01/23/2014   Past Medical History:  Diagnosis Date   GERD (gastroesophageal reflux disease)    Migraines    Panic attacks    Family History  Problem Relation Age of Onset   Hyperthyroidism Mother    Asthma Brother    Hypertension Maternal Grandmother    Hyperthyroidism Maternal Grandmother    Bladder Cancer Neg Hx    Uterine cancer Neg Hx    Renal cancer Neg Hx    Past Surgical History:  Procedure Laterality Date   CESAREAN SECTION  11/23/2011   Procedure: CESAREAN SECTION;  Surgeon: Olam Mill, MD;  Location: [facility];  Service: Gynecology;  Laterality: N/A;  Primary Cesarean Section Delivery Boy @ [phone],  Apgars 6/8   Social History   Occupational History    Not on file  Tobacco Use   Smoking status: Never   Smokeless tobacco: Never  Vaping Use   Vaping status: Never Used  Substance and Sexual Activity   Alcohol use: No   Drug use: No   Sexual activity: Yes    Partners: Male    Birth control/protection: I.U.D.

## 2024-02-21 NOTE — Progress Notes (Signed)
 Pain Scale   Average Pain 6  Patient advising she has chronic lower back pain that radiates to her right leg at times and she advises that it feels like her back locks up        +Driver, -BT, -Dye Allergies.

## 2024-02-26 ENCOUNTER — Encounter

## 2024-02-26 ENCOUNTER — Encounter: Payer: Self-pay | Admitting: Physical Medicine and Rehabilitation

## 2024-03-06 ENCOUNTER — Encounter

## 2024-03-14 ENCOUNTER — Other Ambulatory Visit

## 2024-04-17 ENCOUNTER — Other Ambulatory Visit (HOSPITAL_COMMUNITY)
Admission: RE | Admit: 2024-04-17 | Discharge: 2024-04-17 | Disposition: A | Source: Ambulatory Visit | Attending: Obstetrics and Gynecology | Admitting: Obstetrics and Gynecology

## 2024-04-17 ENCOUNTER — Ambulatory Visit: Payer: Self-pay | Admitting: Obstetrics and Gynecology

## 2024-04-17 ENCOUNTER — Encounter: Payer: Self-pay | Admitting: Obstetrics and Gynecology

## 2024-04-17 VITALS — BP 113/77 | HR 66 | Wt 144.2 lb

## 2024-04-17 DIAGNOSIS — N941 Unspecified dyspareunia: Secondary | ICD-10-CM | POA: Diagnosis not present

## 2024-04-17 DIAGNOSIS — N898 Other specified noninflammatory disorders of vagina: Secondary | ICD-10-CM | POA: Diagnosis present

## 2024-04-17 DIAGNOSIS — R102 Pelvic and perineal pain unspecified side: Secondary | ICD-10-CM

## 2024-04-17 NOTE — Progress Notes (Signed)
 Pt had Mirena inserted 04/19/2017; she reports pain with intercourse and decreased libido since last year. She has completed therapy for pelvic floor integrity. She feels she is also gaining weight which concerns her.   Pt reports current vaginal itching and white discharge. She states sometimes when she urinates that she only goes a little and doesn't feel she is completely emptying her bladder.

## 2024-04-17 NOTE — Progress Notes (Signed)
 "  GYNECOLOGY OFFICE NOTE  History:  29 y.o. G1P1001 here today for painful intercourse, certain positions are more painful. With entry, at the introitus, her skin is very sensitive.  This started around April/May 2025.   She has been given a cream, diclofenac  cream, for the vulvar area. Reports diclofenac  cream helped some but she feels like it hasn't helped much. Dr. Rudy sent her to pelvic floor physical therapy and she has gone to that but that has not helped.   Past Medical History:  Diagnosis Date   GERD (gastroesophageal reflux disease)    Migraines    Panic attacks     Past Surgical History:  Procedure Laterality Date   CESAREAN SECTION  11/23/2011   Procedure: CESAREAN SECTION;  Surgeon: Olam Mill, MD;  Location: [facility];  Service: Gynecology;  Laterality: N/A;  Primary Cesarean Section Delivery Boy @ [phone],  Apgars 6/8    Current Medications[1]  The following portions of the patient's history were reviewed and updated as appropriate: allergies, current medications, past family history, past medical history, past social history, past surgical history and problem list.   Review of Systems:  Pertinent items noted in HPI and remainder of comprehensive ROS otherwise negative.   Objective:  Physical Exam BP 113/77   Pulse 66   Wt 144 lb 3.2 oz (65.4 kg)   LMP 04/04/2024 (Exact Date)   BMI 27.58 kg/m  CONSTITUTIONAL: Well-developed, well-nourished female in no acute distress.  HENT:  Normocephalic, atraumatic. External right and left ear normal. Oropharynx is clear and moist EYES: Conjunctivae and EOM are normal. Pupils are equal, round, and reactive to light. No scleral icterus.  NECK: Normal range of motion, supple, no masses SKIN: Skin is warm and dry. No rash noted. Not diaphoretic. No erythema. No pallor. NEUROLOGIC: Alert and oriented to person, place, and time. Normal reflexes, muscle tone coordination. No cranial nerve deficit noted. PSYCHIATRIC: Normal  mood and affect. Normal behavior. Normal judgment and thought content. CARDIOVASCULAR: Normal heart rate noted RESPIRATORY: Effort normal, no problems with respiration noted ABDOMEN: Soft, no distention noted.   PELVIC: Normal appearing external genitalia; normal appearing vaginal mucosa and cervix.  Thick white discharge noted.  IUD strings noted at os. pelvic cultures obtained.  MUSCULOSKELETAL: Normal range of motion. No edema noted.  Exam done with chaperone present.  Labs and Imaging No results found.  Assessment & Plan:  1. Vaginal discharge (Primary) - Cervicovaginal ancillary only( [place])  2. Pelvic pain Pt reports she has bene to Urogyn and was diagnosed with a weak pelvic floor, recommended for PFPT but she has only been once or twice, tried to do exercises at home but did not do much. Thinks it helped a little -rec return to PFPT and complete 6-8 week course of PT to strengthen pelvic floor as this will likely help the most - would be uncommon for IUD in place for 6 years to newly cause issues, though offered to remove IUD, she would like to keep it for now and plan for removal/replacement when it is due next year  3. Dyspareunia in female -suspect secondary to pelvic floor weakness -encouraged her to cont PFPT, she will call to rescheduled   Routine preventative health maintenance measures emphasized. Please refer to After Visit Summary for other counseling recommendations.   Return for prn.   K. Yolanda Moats, MD, [facility]ending [facility] for [facility])       [1]  Current Outpatient Medications:  ibuprofen  (ADVIL ) 800 MG tablet, Take 1 tablet (800 mg total) by mouth every 8 (eight) hours as needed., Disp: 30 tablet, Rfl: 5   polyethylene glycol (MIRALAX ) 17 g packet, Take 17 g by mouth at bedtime., Disp: 14 each, Rfl: 11   cefUROXime  (CEFTIN ) 500 MG tablet, Take 1 tablet (500 mg total) by mouth 2 (two) times daily with a meal.  (Patient not taking: Reported on 04/17/2024), Disp: 28 tablet, Rfl: 0   diclofenac  Sodium (VOLTAREN ) 1 % GEL, Apply 2 g topically 4 (four) times daily. (Patient not taking: Reported on 04/17/2024), Disp: 100 g, Rfl: 1   docusate sodium  (COLACE) 100 MG capsule, Take 1 capsule (100 mg total) by mouth 2 (two) times daily. (Patient not taking: Reported on 04/17/2024), Disp: 30 capsule, Rfl: 11   fluconazole  (DIFLUCAN ) 200 MG tablet, Take 1 tablet (200 mg total) by mouth every 3 (three) days. (Patient not taking: Reported on 04/17/2024), Disp: 3 tablet, Rfl: 1   ketoconazole  (NIZORAL ) 2 % cream, Apply topically daily. (Patient not taking: Reported on 04/17/2024), Disp: 60 g, Rfl: 1   lidocaine  (XYLOCAINE ) 5 % ointment, Use 0.5-1g around 15 min prior to intercourse (Patient not taking: Reported on 04/17/2024), Disp: 35.44 g, Rfl: 0   metroNIDAZOLE  (FLAGYL ) 500 MG tablet, Take 1 tablet (500 mg total) by mouth 2 (two) times daily. (Patient not taking: Reported on 04/17/2024), Disp: 14 tablet, Rfl: 0   phenazopyridine  (PYRIDIUM ) 200 MG tablet, Take 1 tablet (200 mg total) by mouth 3 (three) times daily as needed for pain. (Patient not taking: Reported on 04/17/2024), Disp: 10 tablet, Rfl: 0  "

## 2024-04-21 LAB — CERVICOVAGINAL ANCILLARY ONLY
Bacterial Vaginitis (gardnerella): POSITIVE — AB
Candida Glabrata: NEGATIVE
Candida Vaginitis: POSITIVE — AB
Chlamydia: NEGATIVE
Comment: NEGATIVE
Comment: NEGATIVE
Comment: NEGATIVE
Comment: NEGATIVE
Comment: NEGATIVE
Comment: NORMAL
Neisseria Gonorrhea: NEGATIVE
Trichomonas: NEGATIVE

## 2024-04-22 ENCOUNTER — Ambulatory Visit: Payer: Self-pay | Admitting: Obstetrics and Gynecology

## 2024-04-22 MED ORDER — FLUCONAZOLE 150 MG PO TABS: 150.0000 mg | ORAL_TABLET | Freq: Once | ORAL | 0 refills | Status: AC

## 2024-04-22 MED ORDER — METRONIDAZOLE 500 MG PO TABS: 500.0000 mg | ORAL_TABLET | Freq: Two times a day (BID) | ORAL | 0 refills | Status: AC
